# Patient Record
Sex: Male | Born: 1950 | Race: White | Hispanic: No | Marital: Married | Smoking: Former smoker
Health system: Southern US, Community
[De-identification: ages and names within clinical notes are randomized; demographics above are authoritative.]

## PROBLEM LIST (undated history)

## (undated) DIAGNOSIS — I2510 Atherosclerotic heart disease of native coronary artery without angina pectoris: Secondary | ICD-10-CM

## (undated) DIAGNOSIS — F329 Major depressive disorder, single episode, unspecified: Secondary | ICD-10-CM

## (undated) DIAGNOSIS — F32A Depression, unspecified: Secondary | ICD-10-CM

## (undated) DIAGNOSIS — F419 Anxiety disorder, unspecified: Secondary | ICD-10-CM

## (undated) DIAGNOSIS — E785 Hyperlipidemia, unspecified: Secondary | ICD-10-CM

## (undated) HISTORY — PX: HERNIA REPAIR: SHX51

## (undated) HISTORY — DX: Hyperlipidemia, unspecified: E78.5

## (undated) HISTORY — PX: CORONARY ANGIOPLASTY WITH STENT PLACEMENT: SHX49

---

## 2009-10-25 ENCOUNTER — Inpatient Hospital Stay (HOSPITAL_COMMUNITY): Admission: EM | Admit: 2009-10-25 | Discharge: 2009-10-28 | Payer: Self-pay | Admitting: Emergency Medicine

## 2009-10-25 ENCOUNTER — Encounter (INDEPENDENT_AMBULATORY_CARE_PROVIDER_SITE_OTHER): Payer: Self-pay | Admitting: Cardiology

## 2009-11-06 ENCOUNTER — Inpatient Hospital Stay (HOSPITAL_COMMUNITY): Admission: EM | Admit: 2009-11-06 | Discharge: 2009-11-07 | Payer: Self-pay | Admitting: Emergency Medicine

## 2009-11-06 ENCOUNTER — Encounter (HOSPITAL_COMMUNITY): Admission: RE | Admit: 2009-11-06 | Discharge: 2009-11-06 | Payer: Self-pay | Admitting: Cardiovascular Disease

## 2010-08-31 LAB — CBC
HCT: 36.9 % — ABNORMAL LOW (ref 39.0–52.0)
HCT: 38.7 % — ABNORMAL LOW (ref 39.0–52.0)
HCT: 39.3 % (ref 39.0–52.0)
HCT: 40 % (ref 39.0–52.0)
Hemoglobin: 13.6 g/dL (ref 13.0–17.0)
Hemoglobin: 13.8 g/dL (ref 13.0–17.0)
Hemoglobin: 13.8 g/dL (ref 13.0–17.0)
MCHC: 35.4 g/dL (ref 30.0–36.0)
MCHC: 35.6 g/dL (ref 30.0–36.0)
MCHC: 35.7 g/dL (ref 30.0–36.0)
MCV: 89.1 fL (ref 78.0–100.0)
MCV: 89.4 fL (ref 78.0–100.0)
MCV: 90.3 fL (ref 78.0–100.0)
Platelets: 221 10*3/uL (ref 150–400)
Platelets: 234 10*3/uL (ref 150–400)
RBC: 4.34 MIL/uL (ref 4.22–5.81)
RBC: 4.39 MIL/uL (ref 4.22–5.81)
RBC: 4.43 MIL/uL (ref 4.22–5.81)
RDW: 13 % (ref 11.5–15.5)
WBC: 6.8 10*3/uL (ref 4.0–10.5)
WBC: 6.9 10*3/uL (ref 4.0–10.5)
WBC: 7.3 10*3/uL (ref 4.0–10.5)

## 2010-08-31 LAB — BASIC METABOLIC PANEL
BUN: 10 mg/dL (ref 6–23)
BUN: 12 mg/dL (ref 6–23)
CO2: 22 mEq/L (ref 19–32)
CO2: 23 mEq/L (ref 19–32)
CO2: 23 mEq/L (ref 19–32)
Calcium: 8.5 mg/dL (ref 8.4–10.5)
Calcium: 8.8 mg/dL (ref 8.4–10.5)
Chloride: 108 mEq/L (ref 96–112)
Creatinine, Ser: 0.78 mg/dL (ref 0.4–1.5)
Creatinine, Ser: 0.84 mg/dL (ref 0.4–1.5)
Creatinine, Ser: 0.84 mg/dL (ref 0.4–1.5)
GFR calc Af Amer: >60 mL/min (ref >60)
GFR calc Af Amer: >60 mL/min (ref >60)
GFR calc Af Amer: >60 mL/min (ref >60)
GFR calc Af Amer: >60 mL/min (ref >60)
GFR calc non Af Amer: >60 mL/min (ref >60)
GFR calc non Af Amer: >60 mL/min (ref >60)
GFR calc non Af Amer: >60 mL/min (ref >60)
Glucose, Bld: 96 mg/dL (ref 70–99)
Glucose, Bld: 99 mg/dL (ref 70–99)
Potassium: 3.7 mEq/L (ref 3.5–5.1)
Sodium: 136 mEq/L (ref 135–145)
Sodium: 139 mEq/L (ref 135–145)
Sodium: 139 mEq/L (ref 135–145)

## 2010-08-31 LAB — HEPARIN LEVEL (UNFRACTIONATED)
Heparin Unfractionated: 0.39 IU/mL (ref 0.30–0.70)
Heparin Unfractionated: 0.39 IU/mL (ref 0.30–0.70)

## 2010-08-31 LAB — CARDIAC PANEL(CRET KIN+CKTOT+MB+TROPI)
Relative Index: 2.3 (ref 0.0–2.5)
Troponin I: 0.01 ng/mL (ref 0.00–0.06)

## 2010-08-31 LAB — DIFFERENTIAL
Basophils Absolute: 0.1 10*3/uL (ref 0.0–0.1)
Eosinophils Absolute: 0.2 10*3/uL (ref 0.0–0.7)
Eosinophils Relative: 3 % (ref 0–5)
Lymphocytes Relative: 39 % (ref 12–46)
Neutrophils Relative %: 47 % (ref 43–77)

## 2010-08-31 LAB — CK TOTAL AND CKMB (NOT AT ARMC): CK, MB: 3.4 ng/mL (ref 0.3–4.0)

## 2010-08-31 LAB — LIPID PANEL
LDL Cholesterol: 132 mg/dL — ABNORMAL HIGH (ref 0–99)
Triglycerides: 182 mg/dL — ABNORMAL HIGH (ref <150)
VLDL: 36 mg/dL (ref 0–40)

## 2010-08-31 LAB — TROPONIN I: Troponin I: 0.01 ng/mL (ref 0.00–0.06)

## 2010-08-31 LAB — PROTIME-INR: Prothrombin Time: 12.3 seconds (ref 11.6–15.2)

## 2010-09-01 ENCOUNTER — Emergency Department (HOSPITAL_COMMUNITY)
Admission: EM | Admit: 2010-09-01 | Discharge: 2010-09-01 | Disposition: A | Discharge status: Home / Self Care | Payer: 59 | Attending: Emergency Medicine | Admitting: Emergency Medicine

## 2010-09-01 DIAGNOSIS — E7800 Pure hypercholesterolemia, unspecified: Secondary | ICD-10-CM | POA: Insufficient documentation

## 2010-09-01 DIAGNOSIS — Z79899 Other long term (current) drug therapy: Secondary | ICD-10-CM | POA: Insufficient documentation

## 2010-09-01 DIAGNOSIS — F341 Dysthymic disorder: Secondary | ICD-10-CM | POA: Insufficient documentation

## 2010-09-01 DIAGNOSIS — I2510 Atherosclerotic heart disease of native coronary artery without angina pectoris: Secondary | ICD-10-CM | POA: Insufficient documentation

## 2010-09-01 DIAGNOSIS — Z7982 Long term (current) use of aspirin: Secondary | ICD-10-CM | POA: Insufficient documentation

## 2010-09-01 DIAGNOSIS — I10 Essential (primary) hypertension: Secondary | ICD-10-CM | POA: Insufficient documentation

## 2010-09-01 LAB — POCT CARDIAC MARKERS
CKMB, poc: 2.9 ng/mL (ref 1.0–8.0)
Myoglobin, poc: 125 ng/mL (ref 12–200)
Troponin i, poc: <0.05 ng/mL (ref 0.00–0.09)

## 2010-09-01 LAB — CARDIAC PANEL(CRET KIN+CKTOT+MB+TROPI)
CK, MB: 4.1 ng/mL — ABNORMAL HIGH (ref 0.3–4.0)
Total CK: 170 U/L (ref 7–232)
Total CK: 186 U/L (ref 7–232)
Troponin I: 0.01 ng/mL (ref 0.00–0.06)

## 2010-09-01 LAB — CBC
HCT: 40.4 % (ref 39.0–52.0)
Hemoglobin: 14.3 g/dL (ref 13.0–17.0)
MCV: 89.8 fL (ref 78.0–100.0)
RDW: 13.5 % (ref 11.5–15.5)
WBC: 10.2 10*3/uL (ref 4.0–10.5)

## 2010-09-01 LAB — BASIC METABOLIC PANEL
Chloride: 108 mEq/L (ref 96–112)
Potassium: 3.9 mEq/L (ref 3.5–5.1)
Sodium: 137 mEq/L (ref 135–145)

## 2010-09-01 LAB — TSH: TSH: 2.334 u[IU]/mL (ref 0.350–4.500)

## 2010-09-01 LAB — HEMOGLOBIN A1C: Mean Plasma Glucose: 117 mg/dL — ABNORMAL HIGH (ref <117)

## 2010-09-01 LAB — DIFFERENTIAL
Monocytes Relative: 8 % (ref 3–12)
Neutro Abs: 6 10*3/uL (ref 1.7–7.7)
Neutrophils Relative %: 59 % (ref 43–77)

## 2010-09-01 LAB — PROTIME-INR: INR: 0.92 (ref 0.00–1.49)

## 2011-06-15 HISTORY — PX: NM MYOVIEW LTD: HXRAD82

## 2011-12-22 ENCOUNTER — Emergency Department (HOSPITAL_COMMUNITY): Payer: Managed Care, Other (non HMO)

## 2011-12-22 ENCOUNTER — Observation Stay (HOSPITAL_COMMUNITY)
Admission: EM | Admit: 2011-12-22 | Discharge: 2011-12-23 | Disposition: A | Discharge status: Home / Self Care | Payer: Managed Care, Other (non HMO) | Attending: Internal Medicine | Admitting: Internal Medicine

## 2011-12-22 ENCOUNTER — Encounter (HOSPITAL_COMMUNITY): Payer: Self-pay | Admitting: *Deleted

## 2011-12-22 DIAGNOSIS — E669 Obesity, unspecified: Secondary | ICD-10-CM | POA: Insufficient documentation

## 2011-12-22 DIAGNOSIS — F3181 Bipolar II disorder: Secondary | ICD-10-CM | POA: Diagnosis present

## 2011-12-22 DIAGNOSIS — Z6831 Body mass index (BMI) 31.0-31.9, adult: Secondary | ICD-10-CM | POA: Insufficient documentation

## 2011-12-22 DIAGNOSIS — F3189 Other bipolar disorder: Secondary | ICD-10-CM

## 2011-12-22 DIAGNOSIS — I2510 Atherosclerotic heart disease of native coronary artery without angina pectoris: Secondary | ICD-10-CM | POA: Insufficient documentation

## 2011-12-22 DIAGNOSIS — F410 Panic disorder [episodic paroxysmal anxiety] without agoraphobia: Principal | ICD-10-CM | POA: Insufficient documentation

## 2011-12-22 DIAGNOSIS — R079 Chest pain, unspecified: Secondary | ICD-10-CM | POA: Insufficient documentation

## 2011-12-22 HISTORY — DX: Depression, unspecified: F32.A

## 2011-12-22 HISTORY — DX: Major depressive disorder, single episode, unspecified: F32.9

## 2011-12-22 HISTORY — DX: Atherosclerotic heart disease of native coronary artery without angina pectoris: I25.10

## 2011-12-22 HISTORY — DX: Anxiety disorder, unspecified: F41.9

## 2011-12-22 LAB — CBC WITH DIFFERENTIAL/PLATELET
Basophils Absolute: 0 10*3/uL (ref 0.0–0.1)
Eosinophils Absolute: 0 10*3/uL (ref 0.0–0.7)
Eosinophils Relative: 1 % (ref 0–5)
MCH: 30.7 pg (ref 26.0–34.0)
MCHC: 34.2 g/dL (ref 30.0–36.0)
MCV: 89.6 fL (ref 78.0–100.0)
Monocytes Absolute: 0.6 10*3/uL (ref 0.1–1.0)
Platelets: 223 10*3/uL (ref 150–400)
RDW: 12.6 % (ref 11.5–15.5)

## 2011-12-22 LAB — HEPATIC FUNCTION PANEL
Alkaline Phosphatase: 54 U/L (ref 39–117)
Bilirubin, Direct: 0.1 mg/dL (ref 0.0–0.3)
Indirect Bilirubin: 0.3 mg/dL (ref 0.3–0.9)
Total Protein: 6.4 g/dL (ref 6.0–8.3)

## 2011-12-22 LAB — BASIC METABOLIC PANEL
BUN: 18 mg/dL (ref 6–23)
Calcium: 9.2 mg/dL (ref 8.4–10.5)
Creatinine, Ser: 0.89 mg/dL (ref 0.50–1.35)
GFR calc Af Amer: >90 mL/min (ref >90)

## 2011-12-22 LAB — TROPONIN I: Troponin I: <0.3 ng/mL (ref <0.30)

## 2011-12-22 MED ORDER — OMEGA-3-ACID ETHYL ESTERS 1 G PO CAPS
2.0000 | ORAL_CAPSULE | Freq: Two times a day (BID) | ORAL | Status: DC
  Administered 2011-12-23: 2 g via ORAL
  Filled 2011-12-22 (×2): qty 2

## 2011-12-22 MED ORDER — CLOPIDOGREL BISULFATE 75 MG PO TABS
75.0000 mg | ORAL_TABLET | Freq: Every day | ORAL | Status: DC
  Administered 2011-12-23: 75 mg via ORAL
  Filled 2011-12-22: qty 1

## 2011-12-22 MED ORDER — ASPIRIN 81 MG PO CHEW
324.0000 mg | CHEWABLE_TABLET | Freq: Once | ORAL | Status: AC
  Administered 2011-12-22: 324 mg via ORAL
  Filled 2011-12-22: qty 4

## 2011-12-22 MED ORDER — SIMVASTATIN 20 MG PO TABS: 20.0000 mg | ORAL_TABLET | Freq: Every evening | ORAL | Status: DC

## 2011-12-22 MED ORDER — ASPIRIN EC 81 MG PO TBEC
81.0000 mg | DELAYED_RELEASE_TABLET | Freq: Every day | ORAL | Status: DC
  Administered 2011-12-23: 81 mg via ORAL
  Filled 2011-12-22: qty 1

## 2011-12-22 MED ORDER — LAMOTRIGINE 100 MG PO TABS
100.0000 mg | ORAL_TABLET | Freq: Every day | ORAL | Status: DC
  Filled 2011-12-22: qty 1

## 2011-12-22 MED ORDER — ENOXAPARIN SODIUM 40 MG/0.4ML ~~LOC~~ SOLN
40.0000 mg | SUBCUTANEOUS | Status: DC
  Administered 2011-12-23: 40 mg via SUBCUTANEOUS
  Filled 2011-12-22 (×2): qty 0.4

## 2011-12-22 MED ORDER — POTASSIUM CHLORIDE IN NACL 20-0.9 MEQ/L-% IV SOLN
INTRAVENOUS | Status: DC
  Administered 2011-12-23 (×2): via INTRAVENOUS

## 2011-12-22 MED ORDER — ONDANSETRON HCL 4 MG/2ML IJ SOLN: 4.0000 mg | INTRAMUSCULAR | Status: DC | PRN

## 2011-12-22 MED ORDER — SODIUM CHLORIDE 0.9 % IJ SOLN
3.0000 mL | Freq: Two times a day (BID) | INTRAMUSCULAR | Status: DC
  Administered 2011-12-23: 3 mL via INTRAVENOUS
  Filled 2011-12-22: qty 3

## 2011-12-22 MED ORDER — PANTOPRAZOLE SODIUM 40 MG PO TBEC
40.0000 mg | DELAYED_RELEASE_TABLET | Freq: Every day | ORAL | Status: DC
  Administered 2011-12-23 (×2): 40 mg via ORAL
  Filled 2011-12-22 (×2): qty 1

## 2011-12-22 MED ORDER — HYDROMORPHONE HCL PF 1 MG/ML IJ SOLN
0.5000 mg | INTRAMUSCULAR | Status: DC | PRN
  Administered 2011-12-23: 0.5 mg via INTRAVENOUS
  Filled 2011-12-22: qty 1

## 2011-12-22 MED ORDER — TRAZODONE HCL 50 MG PO TABS
25.0000 mg | ORAL_TABLET | Freq: Every evening | ORAL | Status: DC | PRN
  Administered 2011-12-23: 25 mg via ORAL
  Filled 2011-12-22: qty 1

## 2011-12-22 MED ORDER — BISACODYL 5 MG PO TBEC: 5.0000 mg | DELAYED_RELEASE_TABLET | Freq: Every day | ORAL | Status: DC | PRN

## 2011-12-22 MED ORDER — DIVALPROEX SODIUM ER 500 MG PO TB24: 1000.0000 mg | ORAL_TABLET | Freq: Every day | ORAL | Status: DC

## 2011-12-22 MED ORDER — ACETAMINOPHEN 325 MG PO TABS: 650.0000 mg | ORAL_TABLET | ORAL | Status: DC | PRN

## 2011-12-22 MED ORDER — OXYCODONE HCL 5 MG PO TABS
5.0000 mg | ORAL_TABLET | ORAL | Status: DC | PRN
  Administered 2011-12-23: 5 mg via ORAL
  Filled 2011-12-22: qty 1

## 2011-12-22 NOTE — ED Notes (Signed)
Lt chest pain for 3 days, worse today, no cough or cold, no injury. Alert, anxious

## 2011-12-22 NOTE — H&P (Signed)
[facility] History and Physical  Maurice Duffy ZOX:[id number] DOB: 05-Aug-1950 DOA: 12/22/2011   PCP: [facility] Cardiologist: Dr. Nanetta Batty  Chief Complaint:  Episodic chest pain for the past 2 days  HPI: Maurice Duffy is an 61 y.o. male.   Middle-aged Caucasian gentleman with a history of anxiety and bipolar,  Disorder, coronary artery disease status post drug-eluting stent, with patent stents and arteries on cardiac catheterization in 2011. Now presents with the above complaints.  The pain is described as sticking in his arms and chest lasting for seconds to a few minutes; aggravated by deep breathing or moving his arms; aggravated by stress relieved by remaining calm; There is no associated palpitations nausea vomiting diaphoresis dizziness or syncope.  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 He says he had an episode of anxiety yesterday morning which she's never had the likes of which before and thinks it may have been something similar to a panic attack.  He works out in a gym 3 times per week and has been doing that steadily for over 3 months, lifting weights for up to 2 hours at a time and there's never been any chest pains, or any other anginal-like symptoms while at the gym. He usually takes Prevacid for GI problems which she describes as gastroparesis, but recently ran out  Takes all his other medications regularly including his Plavix;  Has been started on Depakote and Lamictal for his bipolar disorder and says it has been helping. He does not get any form of counseling from his psychiatrist.  He is concerned about his serum lipids.  Rewiew of Systems:  The patient denies anorexia, fever, weight loss,, vision loss, decreased hearing, hoarseness,  syncope, dyspnea on exertion, peripheral edema, balance deficits,  hemoptysis, abdominal pain, melena, hematochezia, severe indigestion/heartburn, hematuria, incontinence, genital sores, muscle weakness, suspicious skin lesions, transient blindness, difficulty walking, unusual weight change, abnormal bleeding, enlarged lymph nodes, angioedema, and breast masses.    Past Medical History  Diagnosis Date  . Coronary artery disease   . Anxiety   . Depression     Past Surgical History  Procedure Date  . Coronary angioplasty with stent placement     Medications:  HOME MEDS: Prior to Admission medications   Medication Sig Start Date End Date Taking? Authorizing Provider  clopidogrel (PLAVIX) 75 MG tablet Take 75 mg by mouth daily.   Yes Historical Provider, MD  divalproex (DEPAKOTE ER) 500 MG 24 hr tablet Take 1,000 mg by mouth at bedtime.   Yes Historical Provider, MD  fish oil-omega-3 fatty acids 1000 MG capsule Take 2 g by mouth 2 (two) times daily.   Yes Historical Provider, MD  lamoTRIgine (LAMICTAL) 100 MG tablet Take 100 mg by mouth at bedtime.   Yes Historical Provider, MD  simvastatin (ZOCOR) 20 MG tablet Take 20 mg by mouth every evening.   Yes Historical Provider, MD     Allergies:  No Known Allergies  Social History:   reports that he quit smoking about 4 months ago. He does not have any smokeless tobacco history on file. He reports that he does not drink alcohol or use illicit drugs.  Family History: Family History  Problem Relation Age of Onset  . Coronary artery disease Father 21     Physical Exam: Filed Vitals:   12/22/11 1850  12/22/11 1944 12/22/11 2105 12/22/11 2149  BP: 145/88 145/79 126/55 159/80  Pulse: 77 72 66 59  Temp: 99.2 F (37.3 C)   97.9 F (36.6 C)  TempSrc: Oral   Oral  Resp: 20 16 17 18   Height:    5\' 9"  (1.753 m)  Weight:    95.6 kg (210 lb 12.2 oz)  SpO2: 95% 96% 99% 97%   Blood pressure 159/80, pulse 59, temperature 97.9 F (36.6 C), temperature source Oral, resp. rate 18, height 5\' 9"  (1.753 m),  weight 95.6 kg (210 lb 12.2 oz), SpO2 97.00%.  GEN:  Pleasant pleasant but extremely anxious middle-aged Caucasian gentleman lying in the stretcher in no acute distress; cooperative with exam PSYCH:  alert and oriented x4; appears anxious and frequently seems to get confused when attempting to answer questions and his wife and daughter were present at the interview assist him.;  HEENT: Mucous membranes pink and anicteric; PERRLA; EOM intact; no cervical lymphadenopathy nor thyromegaly or carotid bruit; no JVD; Breasts:: Not examined CHEST WALL: No tenderness CHEST: Normal respiration, clear to auscultation bilaterally HEART: Regular rate and rhythm; no murmurs rubs or gallops BACK: No kyphosis or scoliosis; no CVA tenderness ABDOMEN: Obese, soft non-tender; no masses, no organomegaly, normal abdominal bowel sounds; Rectal Exam: Not done EXTREMITIES: ; age-appropriate arthropathy of the hands and knees; no edema; no ulcerations. Genitalia: not examined PULSES: 2+ and symmetric SKIN: Normal hydration no rash or ulceration CNS: Cranial nerves 2-12 grossly intact no focal lateralizing neurologic deficit   Labs & Imaging Results for orders placed during the hospital encounter of 12/22/11 (from the past 48 hour(s))  CBC WITH DIFFERENTIAL     Status: Normal   Collection Time   12/22/11  6:54 PM      Component Value Range Comment   WBC 7.1  4.0 - 10.5 K/uL    RBC 4.60  4.22 - 5.81 MIL/uL    Hemoglobin 14.1  13.0 - 17.0 g/dL    HCT 14.7  82.9 - 56.2 %    MCV 89.6  78.0 - 100.0 fL    MCH 30.7  26.0 - 34.0 pg    MCHC 34.2  30.0 - 36.0 g/dL    RDW 13.0  86.5 - 78.4 %    Platelets 223  150 - 400 K/uL    Neutrophils Relative 67  43 - 77 %    Neutro Abs 4.8  1.7 - 7.7 K/uL    Lymphocytes Relative 23  12 - 46 %    Lymphs Abs 1.6  0.7 - 4.0 K/uL    Monocytes Relative 9  3 - 12 %    Monocytes Absolute 0.6  0.1 - 1.0 K/uL    Eosinophils Relative 1  0 - 5 %    Eosinophils Absolute 0.0  0.0 - 0.7  K/uL    Basophils Relative 0  0 - 1 %    Basophils Absolute 0.0  0.0 - 0.1 K/uL   TROPONIN I     Status: Normal   Collection Time   12/22/11  6:54 PM      Component Value Range Comment   Troponin I <0.30  <0.30 ng/mL   BASIC METABOLIC PANEL     Status: Abnormal   Collection Time   12/22/11  6:54 PM      Component Value Range Comment   Sodium 138  135 - 145 mEq/L    Potassium 3.6  3.5 - 5.1 mEq/L    Chloride 104  96 - 112 mEq/L    CO2 23  19 - 32 mEq/L    Glucose, Bld 135 (*) 70 - 99 mg/dL    BUN 18  6 - 23 mg/dL    Creatinine, Ser 1.61  0.50 - 1.35 mg/dL    Calcium 9.2  8.4 - 09.6 mg/dL    GFR calc non Af Amer >90  >90 mL/min    GFR calc Af Amer >90  >90 mL/min   HEPATIC FUNCTION PANEL     Status: Normal   Collection Time   12/22/11  6:54 PM      Component Value Range Comment   Total Protein 6.4  6.0 - 8.3 g/dL    Albumin 3.9  3.5 - 5.2 g/dL    AST 19  0 - 37 U/L    ALT 17  0 - 53 U/L    Alkaline Phosphatase 54  39 - 117 U/L    Total Bilirubin 0.4  0.3 - 1.2 mg/dL    Bilirubin, Direct 0.1  0.0 - 0.3 mg/dL    Indirect Bilirubin 0.3  0.3 - 0.9 mg/dL    Dg Chest Portable 1 View  12/22/2011  *RADIOLOGY REPORT*  Clinical Data: Intermittent chest pain.  Coronary artery disease. Smoking history.  PORTABLE CHEST - 1 VIEW  Comparison: 11/06/2009  Findings: Artifact overlies chest.  Heart size is normal. Mediastinal shadows are normal.  Lungs clear.  No effusions.  No bony abnormalities.  IMPRESSION: No active disease  Original Report Authenticated By: Thomasenia Sales, M.D.   His EKG read by me is normal sinus rhythm with no ST segment abnormalities   Assessment Present on Admission:  .Chest pain .CAD (coronary artery disease) .Anxiety attack Possible Gastroparesis/GERD  .Bipolar 2 disorder  .Obesity    PLAN: We'll continue this gentleman on observation to cycle his cardiac enzymes to rule out any acute myocardial events Continue treatment of his bipolar disorder; Will not  start anxiolytics will counsel on the need for therapy, meditation or other proven stress management strategy. We'll give her PPI to cover for GERD Counseled on the importance of optimal management of weight We'll check fasting serum lipids  Other plans as per orders.   Guyla Bless 12/22/2011, 10:57 PM

## 2011-12-22 NOTE — ED Notes (Signed)
X ray in room for chest xray.

## 2011-12-22 NOTE — ED Provider Notes (Cosign Needed)
History     CSN: [id number]  Arrival date & time 12/22/11  1840   First MD Initiated Contact with Patient 12/22/11 1843      Chief Complaint  Patient presents with  . Chest Pain    (Consider location/radiation/quality/duration/timing/severity/associated sxs/prior treatment) Patient is a 61 y.o. male presenting with chest pain. The history is provided by the patient (pt complains of chest pain today). No language interpreter was used.  Chest Pain The chest pain began 3 - 5 hours ago. Chest pain occurs frequently. The chest pain is resolved. The pain is associated with stress. At its most intense, the pain is at 4/10. The pain is currently at 0/10. The severity of the pain is moderate. The quality of the pain is described as aching. The pain does not radiate. Pertinent negatives for primary symptoms include no fatigue, no cough and no abdominal pain.  Pertinent negatives for past medical history include no seizures.     Past Medical History  Diagnosis Date  . Coronary artery disease   . Anxiety   . Depression     Past Surgical History  Procedure Date  . Coronary angioplasty with stent placement     History reviewed. No pertinent family history.  History  Substance Use Topics  . Smoking status: Former Games developer  . Smokeless tobacco: Not on file  . Alcohol Use: No      Review of Systems  Constitutional: Negative for fatigue.  HENT: Negative for congestion, sinus pressure and ear discharge.   Eyes: Negative for discharge.  Respiratory: Negative for cough.   Cardiovascular: Positive for chest pain.  Gastrointestinal: Negative for abdominal pain and diarrhea.  Genitourinary: Negative for frequency and hematuria.  Musculoskeletal: Negative for back pain.  Skin: Negative for rash.  Neurological: Negative for seizures and headaches.  Hematological: Negative.   Psychiatric/Behavioral: Negative for hallucinations.    Allergies  Review of patient's allergies indicates no  known allergies.  Home Medications  No current outpatient prescriptions on file.  BP 126/55  Pulse 66  Temp 99.2 F (37.3 C) (Oral)  Resp 17  Ht 5\' 9"  (1.753 m)  Wt 210 lb (95.255 kg)  BMI 31.01 kg/m2  SpO2 99%  Physical Exam  Constitutional: He is oriented to person, place, and time. He appears well-developed.  HENT:  Head: Normocephalic and atraumatic.  Eyes: Conjunctivae and EOM are normal. No scleral icterus.  Neck: Neck supple. No thyromegaly present.  Cardiovascular: Normal rate and regular rhythm.  Exam reveals no gallop and no friction rub.   No murmur heard. Pulmonary/Chest: No stridor. He has no wheezes. He has no rales. He exhibits no tenderness.  Abdominal: He exhibits no distension. There is no tenderness. There is no rebound.  Musculoskeletal: Normal range of motion. He exhibits no edema.  Lymphadenopathy:    He has no cervical adenopathy.  Neurological: He is oriented to person, place, and time. Coordination normal.  Skin: No rash noted. No erythema.  Psychiatric: He has a normal mood and affect. His behavior is normal.    ED Course  Procedures (including critical care time)  Labs Reviewed  BASIC METABOLIC PANEL - Abnormal; Notable for the following:    Glucose, Bld 135 (*)     All other components within normal limits  CBC WITH DIFFERENTIAL  TROPONIN I  HEPATIC FUNCTION PANEL   Dg Chest Portable 1 View  12/22/2011  *RADIOLOGY REPORT*  Clinical Data: Intermittent chest pain.  Coronary artery disease. Smoking history.  PORTABLE CHEST -  1 VIEW  Comparison: 11/06/2009  Findings: Artifact overlies chest.  Heart size is normal. Mediastinal shadows are normal.  Lungs clear.  No effusions.  No bony abnormalities.  IMPRESSION: No active disease  Original Report Authenticated By: Thomasenia Sales, M.D.     1. Chest pain     Date: 12/22/2011  Rate:75  Rhythm: normal sinus rhythm  QRS Axis: normal  Intervals: normal  ST/T Wave abnormalities: normal   Conduction Disutrbances:none  Narrative Interpretation:   Old EKG Reviewed: none available     MDM           Benny Lennert, MD 12/22/11 2109

## 2011-12-23 ENCOUNTER — Encounter (HOSPITAL_COMMUNITY): Payer: Self-pay | Admitting: *Deleted

## 2011-12-23 DIAGNOSIS — E669 Obesity, unspecified: Secondary | ICD-10-CM

## 2011-12-23 LAB — URINALYSIS, ROUTINE W REFLEX MICROSCOPIC
Bilirubin Urine: NEGATIVE
Glucose, UA: NEGATIVE mg/dL
Nitrite: NEGATIVE
Specific Gravity, Urine: 1.015 (ref 1.005–1.030)
Urobilinogen, UA: 0.2 mg/dL (ref 0.0–1.0)

## 2011-12-23 LAB — LIPID PANEL
LDL Cholesterol: 60 mg/dL (ref 0–99)
Total CHOL/HDL Ratio: 3.1 RATIO
VLDL: 31 mg/dL (ref 0–40)

## 2011-12-23 LAB — CARDIAC PANEL(CRET KIN+CKTOT+MB+TROPI)
Relative Index: 2.1 (ref 0.0–2.5)
Troponin I: <0.3 ng/mL (ref <0.30)

## 2011-12-23 LAB — T4, FREE: Free T4: 1.09 ng/dL (ref 0.80–1.80)

## 2011-12-23 LAB — TSH: TSH: 3.528 u[IU]/mL (ref 0.350–4.500)

## 2011-12-23 LAB — URINE MICROSCOPIC-ADD ON

## 2011-12-23 MED ORDER — ISOSORBIDE MONONITRATE ER 30 MG PO TB24: 30.0000 mg | ORAL_TABLET | Freq: Every day | ORAL | Status: DC

## 2011-12-23 MED ORDER — LAMOTRIGINE 100 MG PO TABS
100.0000 mg | ORAL_TABLET | Freq: Every day | ORAL | Status: DC
  Filled 2011-12-23 (×2): qty 1

## 2011-12-23 MED ORDER — ASPIRIN 81 MG PO TBEC: 81.0000 mg | DELAYED_RELEASE_TABLET | Freq: Every day | ORAL | Status: AC

## 2011-12-23 MED ORDER — SODIUM CHLORIDE 0.9 % IJ SOLN
INTRAMUSCULAR | Status: AC
  Administered 2011-12-23: 10 mL
  Filled 2011-12-23: qty 3

## 2011-12-23 NOTE — Care Management Note (Signed)
    Page 1 of 1   12/23/2011     9:24:51 AM   CARE MANAGEMENT NOTE 12/23/2011  Patient:  Maurice Duffy, Maurice Duffy   Account Number:  [id number]  Date Initiated:  12/23/2011  Documentation initiated by:  Rosemary Holms  Subjective/Objective Assessment:   Pt admitted with C/O chest pain. Lives independently at home with spouse     Action/Plan:   DC home w/ no HH needs identified.   Anticipated DC Date:  12/23/2011   Anticipated DC Plan:  HOME/SELF CARE      DC Planning Services  CM consult      Choice offered to / List presented to:             Status of service:  Completed, signed off Medicare Important Message given?   (If response is "NO", the following Medicare IM given date fields will be blank) Date Medicare IM given:   Date Additional Medicare IM given:    Discharge Disposition:  HOME/SELF CARE  Per UR Regulation:  Reviewed for med. necessity/level of care/duration of stay  If discussed at Long Length of Stay Meetings, dates discussed:    Comments:  12/23/11 [address] Leanord Hawking RN BSN CM

## 2011-12-23 NOTE — Discharge Summary (Signed)
Physician Discharge Summary  Maurice Duffy ZOX:[id number] DOB: [birth year] DOA: 12/22/2011  PCP: No primary provider on file.  Admit date: 12/22/2011 Discharge date: 12/23/2011  Recommendations for Outpatient Follow-up:  1. Follow up with Dr. Allyson Sabal next week for outpatient stress test  Discharge Diagnoses:  Active Problems:  Anxiety attack  Bipolar 2 disorder  Chest pain, atypical, possibly related to anxiety  Obesity  CAD (coronary artery disease) with prior stents   Discharge Condition: improved  Diet recommendation: low salt  History of present illness:  Maurice Duffy is an 61 y.o. male. Middle-aged Caucasian gentleman with a history of anxiety and bipolar, Disorder, coronary artery disease status post drug-eluting stent, with patent stents and arteries on cardiac catheterization in 2011. Now presents with the above complaints.  The pain is described as sticking in his arms and chest lasting for seconds to a few minutes; aggravated by deep breathing or moving his arms; aggravated by stress relieved by remaining calm;  There is no associated palpitations nausea vomiting diaphoresis dizziness or syncope.  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 He says he had an episode of anxiety yesterday morning which she's never had the likes of which before and thinks it may have been something similar to a panic attack.  He works out in a gym 3 times per week and has been doing that steadily for over 3 months, lifting weights for up to 2 hours at a time and there's never been any chest pains, or any other anginal-like symptoms while at the gym.  He usually takes Prevacid for GI problems which she describes as gastroparesis, but recently ran out  Takes all his other medications regularly including his Plavix;  Has been started on Depakote and Lamictal for his  bipolar disorder and says it has been helping. He does not get any form of counseling from his psychiatrist.  He is concerned about his serum lipids.   Hospital Course:  This gentleman was admitted overnight for observation.  He did not have any acute EKG changes and his enzymes were negative.  He was seen by Dr. Allyson Sabal who recommended to start the patient on imdur , and that he would be scheduled for an outpatient stress test next week.  The remainder of his medical problems were stable.  He was advised not to take a proton pump inhibitor while on plavix.  He should use H2 blockers as needed for reflux  Procedures:  none  Consultations:  Cardiology, Dr. Allyson Sabal  Discharge Exam: Filed Vitals:   12/23/11 0519  BP: 110/64  Pulse: 57  Temp: 97.5 F (36.4 C)  Resp: 18   Filed Vitals:   12/22/11 2105 12/22/11 2149 12/23/11 0228 12/23/11 0519  BP: 126/55 159/80 105/66 110/64  Pulse: 66 59 56 57  Temp:  97.9 F (36.6 C) 97.7 F (36.5 C) 97.5 F (36.4 C)  TempSrc:  Oral Oral Oral  Resp: 17 18 18 18   Height:  5\' 9"  (1.753 m)    Weight:  95.6 kg (210 lb 12.2 oz)  95.3 kg (210 lb 1.6 oz)  SpO2: 99% 97% 97% 92%   General: NAD, sitting up in bed, AAOx3 Cardiovascular: s1, s2, rrr Respiratory:  CTA B  Discharge Instructions  Discharge Orders    Future Orders Please Complete By Expires   Diet - low sodium  heart healthy      Increase activity slowly      Call MD for:  severe uncontrolled pain      Call MD for:  difficulty breathing, headache or visual disturbances        Medication List  As of 12/23/2011 12:30 PM   TAKE these medications         aspirin 81 MG EC tablet   Take 1 tablet (81 mg total) by mouth daily.      clopidogrel 75 MG tablet   Commonly known as: PLAVIX   Take 75 mg by mouth daily.      divalproex 500 MG 24 hr tablet   Commonly known as: DEPAKOTE ER   Take 1,000 mg by mouth at bedtime.      fish oil-omega-3 fatty acids 1000 MG capsule   Take 2 g by  mouth 2 (two) times daily.      isosorbide mononitrate 30 MG 24 hr tablet   Commonly known as: IMDUR   Take 1 tablet (30 mg total) by mouth daily.      lamoTRIgine 100 MG tablet   Commonly known as: LAMICTAL   Take 100 mg by mouth daily.      simvastatin 20 MG tablet   Commonly known as: ZOCOR   Take 20 mg by mouth every evening.           Follow-up Information    Follow up with Runell Gess, MD. (next week for stress test)    Contact information:   [address] [place] [address] [phone]           The results of significant diagnostics from this hospitalization (including imaging, microbiology, ancillary and laboratory) are listed below for reference.    Significant Diagnostic Studies: Dg Chest Portable 1 View  12/22/2011  *RADIOLOGY REPORT*  Clinical Data: Intermittent chest pain.  Coronary artery disease. Smoking history.  PORTABLE CHEST - 1 VIEW  Comparison: 11/06/2009  Findings: Artifact overlies chest.  Heart size is normal. Mediastinal shadows are normal.  Lungs clear.  No effusions.  No bony abnormalities.  IMPRESSION: No active disease  Original Report Authenticated By: Thomasenia Sales, M.D.    Microbiology: No results found for this or any previous visit (from the past 240 hour(s)).   Labs: Basic Metabolic Panel:  Lab 12/22/11 8295  NA 138  K 3.6  CL 104  CO2 23  GLUCOSE 135*  BUN 18  CREATININE 0.89  CALCIUM 9.2  MG --  PHOS --   Liver Function Tests:  Lab 12/22/11 1854  AST 19  ALT 17  ALKPHOS 54  BILITOT 0.4  PROT 6.4  ALBUMIN 3.9   No results found for this basename: LIPASE:5,AMYLASE:5 in the last 168 hours No results found for this basename: AMMONIA:5 in the last 168 hours CBC:  Lab 12/22/11 1854  WBC 7.1  NEUTROABS 4.8  HGB 14.1  HCT 41.2  MCV 89.6  PLT 223   Cardiac Enzymes:  Lab 12/23/11 0655 12/22/11 2337 12/22/11 1854  CKTOTAL 222 261* --  CKMB 4.7* 3.8 --  CKMBINDEX -- -- --    TROPONINI <0.30 <0.30 <0.30   BNP: BNP (last 3 results) No results found for this basename: PROBNP:3 in the last 8760 hours CBG: No results found for this basename: GLUCAP:5 in the last 168 hours  Time coordinating discharge:  Signed:  MEMON,JEHANZEB  [facility] 12/23/2011, 12:30 PM

## 2011-12-23 NOTE — Consult Note (Signed)
Reason for Consult: Chest pain  Requesting Physician: [facility]  HPI: This is a 61 y.o. male with a past medical history significant for CAD status post stenting of his mid LAD by myself using a Taxus drug-eluting stent May 2011. 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 He does have an element  of anxiety as well as GERD. He developed atypical chest pain this past Monday and again on Tuesday and Wednesday with some left upper extremity radiation. He was admitted to [facility] forrule out myocardial infarction.Marland Kitchen  PMHx:  Past Medical History  Diagnosis Date  . Coronary artery disease   . Anxiety   . Depression    Past Surgical History  Procedure Date  . Coronary angioplasty with stent placement     FAMHx: Family History  Problem Relation Age of Onset  . Coronary artery disease Father 39    SOCHx:  reports that he quit smoking about 4 months ago. He does not have any smokeless tobacco history on file. He reports that he does not drink alcohol or use illicit drugs.  ALLERGIES: No Known Allergies  ROS: Pertinent items are noted in HPI.  HOME MEDICATIONS: Prescriptions prior to admission  Medication Sig Dispense Refill  . clopidogrel (PLAVIX) 75 MG tablet Take 75 mg by mouth daily.      . divalproex (DEPAKOTE ER) 500 MG 24 hr tablet Take 1,000 mg by mouth at bedtime.      . fish oil-omega-3 fatty acids 1000 MG capsule Take 2 g by mouth 2 (two) times daily.      Marland Kitchen lamoTRIgine (LAMICTAL) 100 MG tablet Take 100 mg by mouth daily.      . simvastatin (ZOCOR) 20 MG tablet Take 20 mg by mouth every evening.        HOSPITAL MEDICATIONS: I have reviewed the patient's current medications.  VITALS: Blood pressure 110/64, pulse 57, temperature 97.5 F (36.4 C), temperature source Oral, resp. rate 18, height 5\' 9"  (1.753 m), weight 95.3  kg (210 lb 1.6 oz), SpO2 92.00%.  PHYSICAL EXAM: General appearance: alert, cooperative, appears stated age and no distress Neck: no adenopathy, no carotid bruit, no JVD, supple, symmetrical, trachea midline and thyroid not enlarged, symmetric, no tenderness/mass/nodules Lungs: clear to auscultation bilaterally Heart: regular rate and rhythm, S1, S2 normal, no murmur, click, rub or gallop Abdomen: soft, non-tender; bowel sounds normal; no masses,  no organomegaly Extremities: extremities normal, atraumatic, no cyanosis or edema Pulses: 2+ and symmetric  LABS: Results for orders placed during the hospital encounter of 12/22/11 (from the past 48 hour(s))  CBC WITH DIFFERENTIAL     Status: Normal   Collection Time   12/22/11  6:54 PM      Component Value Range Comment   WBC 7.1  4.0 - 10.5 K/uL    RBC 4.60  4.22 - 5.81 MIL/uL    Hemoglobin 14.1  13.0 - 17.0 g/dL    HCT 16.1  09.6 - 04.5 %    MCV 89.6  78.0 - 100.0 fL    MCH 30.7  26.0 - 34.0 pg    MCHC 34.2  30.0 - 36.0 g/dL    RDW 40.9  81.1 - 91.4 %    Platelets 223  150 - 400 K/uL    Neutrophils Relative 67  43 - 77 %    Neutro Abs 4.8  1.7 - 7.7  K/uL    Lymphocytes Relative 23  12 - 46 %    Lymphs Abs 1.6  0.7 - 4.0 K/uL    Monocytes Relative 9  3 - 12 %    Monocytes Absolute 0.6  0.1 - 1.0 K/uL    Eosinophils Relative 1  0 - 5 %    Eosinophils Absolute 0.0  0.0 - 0.7 K/uL    Basophils Relative 0  0 - 1 %    Basophils Absolute 0.0  0.0 - 0.1 K/uL   TROPONIN I     Status: Normal   Collection Time   12/22/11  6:54 PM      Component Value Range Comment   Troponin I <0.30  <0.30 ng/mL   BASIC METABOLIC PANEL     Status: Abnormal   Collection Time   12/22/11  6:54 PM      Component Value Range Comment   Sodium 138  135 - 145 mEq/L    Potassium 3.6  3.5 - 5.1 mEq/L    Chloride 104  96 - 112 mEq/L    CO2 23  19 - 32 mEq/L    Glucose, Bld 135 (*) 70 - 99 mg/dL    BUN 18  6 - 23 mg/dL    Creatinine, Ser 1.61  0.50 - 1.35 mg/dL     Calcium 9.2  8.4 - 10.5 mg/dL    GFR calc non Af Amer >90  >90 mL/min    GFR calc Af Amer >90  >90 mL/min   HEPATIC FUNCTION PANEL     Status: Normal   Collection Time   12/22/11  6:54 PM      Component Value Range Comment   Total Protein 6.4  6.0 - 8.3 g/dL    Albumin 3.9  3.5 - 5.2 g/dL    AST 19  0 - 37 U/L    ALT 17  0 - 53 U/L    Alkaline Phosphatase 54  39 - 117 U/L    Total Bilirubin 0.4  0.3 - 1.2 mg/dL    Bilirubin, Direct 0.1  0.0 - 0.3 mg/dL    Indirect Bilirubin 0.3  0.3 - 0.9 mg/dL   CARDIAC PANEL(CRET KIN+CKTOT+MB+TROPI)     Status: Abnormal   Collection Time   12/22/11 11:37 PM      Component Value Range Comment   Total CK 261 (*) 7 - 232 U/L    CK, MB 3.8  0.3 - 4.0 ng/mL    Troponin I <0.30  <0.30 ng/mL    Relative Index 1.5  0.0 - 2.5   LIPID PANEL     Status: Abnormal   Collection Time   12/23/11  6:02 AM      Component Value Range Comment   Cholesterol 135  0 - 200 mg/dL    Triglycerides 096 (*) <150 mg/dL    HDL 44  >04 mg/dL    Total CHOL/HDL Ratio 3.1      VLDL 31  0 - 40 mg/dL    LDL Cholesterol 60  0 - 99 mg/dL   CARDIAC PANEL(CRET KIN+CKTOT+MB+TROPI)     Status: Abnormal   Collection Time   12/23/11  6:55 AM      Component Value Range Comment   Total CK 222  7 - 232 U/L    CK, MB 4.7 (*) 0.3 - 4.0 ng/mL    Troponin I <0.30  <0.30 ng/mL    Relative Index 2.1  0.0 - 2.5  URINALYSIS, ROUTINE W REFLEX MICROSCOPIC     Status: Abnormal   Collection Time   12/23/11  8:47 AM      Component Value Range Comment   Color, Urine YELLOW  YELLOW    APPearance CLEAR  CLEAR    Specific Gravity, Urine 1.015  1.005 - 1.030    pH 6.0  5.0 - 8.0    Glucose, UA NEGATIVE  NEGATIVE mg/dL    Hgb urine dipstick TRACE (*) NEGATIVE    Bilirubin Urine NEGATIVE  NEGATIVE    Ketones, ur NEGATIVE  NEGATIVE mg/dL    Protein, ur NEGATIVE  NEGATIVE mg/dL    Urobilinogen, UA 0.2  0.0 - 1.0 mg/dL    Nitrite NEGATIVE  NEGATIVE    Leukocytes, UA NEGATIVE  NEGATIVE   URINE  MICROSCOPIC-ADD ON     Status: Normal   Collection Time   12/23/11  8:47 AM      Component Value Range Comment   RBC / HPF 0-2  <3 RBC/hpf     IMAGING: Dg Chest Portable 1 View  12/22/2011  *RADIOLOGY REPORT*  Clinical Data: Intermittent chest pain.  Coronary artery disease. Smoking history.  PORTABLE CHEST - 1 VIEW  Comparison: 11/06/2009  Findings: Artifact overlies chest.  Heart size is normal. Mediastinal shadows are normal.  Lungs clear.  No effusions.  No bony abnormalities.  IMPRESSION: No active disease  Original Report Authenticated By: Thomasenia Sales, M.D.    IMPRESSION: 1. Atypical chest pain in setting of known CAD his enzymes are negative and his EKG shows no acute changes. I suspect this is related to anxiety plus or minus GERD but cannot completely exclude ischemic chest pain.  RECOMMENDATION: 1. Recommend beginning a long-acting oral nitrate such as Imdur 30 mg by mouth daily. He can be discharged home and I will order an outpatient exercise myocardial perfusion study next week to rule out an ischemic etiology.  Time Spent Directly with Patient: 30 minutes  Pailynn Vahey J 12/23/2011, 12:10 PM

## 2011-12-23 NOTE — Progress Notes (Signed)
UR complete 

## 2011-12-23 NOTE — Discharge Instructions (Signed)
Chest Pain (Nonspecific) It is often hard to give a specific diagnosis for the cause of chest pain. There is always a chance that your pain could be related to something serious, such as a heart attack or a blood clot in the lungs. You need to follow up with your caregiver for further evaluation. CAUSES   Heartburn.   Pneumonia or bronchitis.   Anxiety or stress.   Inflammation around your heart (pericarditis) or lung (pleuritis or pleurisy).   A blood clot in the lung.   A collapsed lung (pneumothorax). It can develop suddenly on its own (spontaneous pneumothorax) or from injury (trauma) to the chest.   Shingles infection (herpes zoster virus).  The chest wall is composed of bones, muscles, and cartilage. Any of these can be the source of the pain.  The bones can be bruised by injury.   The muscles or cartilage can be strained by coughing or overwork.   The cartilage can be affected by inflammation and become sore (costochondritis).  DIAGNOSIS  Lab tests or other studies, such as X-rays, electrocardiography, stress testing, or cardiac imaging, may be needed to find the cause of your pain.  TREATMENT   Treatment depends on what may be causing your chest pain. Treatment may include:   Acid blockers for heartburn.   Anti-inflammatory medicine.   Pain medicine for inflammatory conditions.   Antibiotics if an infection is present.   You may be advised to change lifestyle habits. This includes stopping smoking and avoiding alcohol, caffeine, and chocolate.   You may be advised to keep your head raised (elevated) when sleeping. This reduces the chance of acid going backward from your stomach into your esophagus.   Most of the time, nonspecific chest pain will improve within 2 to 3 days with rest and mild pain medicine.  HOME CARE INSTRUCTIONS   If antibiotics were prescribed, take your antibiotics as directed. Finish them even if you start to feel better.   For the next few  days, avoid physical activities that bring on chest pain. Continue physical activities as directed.   Do not smoke.   Avoid drinking alcohol.   Only take over-the-counter or prescription medicine for pain, discomfort, or fever as directed by your caregiver.   Follow your caregiver's suggestions for further testing if your chest pain does not go away.   Keep any follow-up appointments you made. If you do not go to an appointment, you could develop lasting (chronic) problems with pain. If there is any problem keeping an appointment, you must call to reschedule.  SEEK MEDICAL CARE IF:   You think you are having problems from the medicine you are taking. Read your medicine instructions carefully.   Your chest pain does not go away, even after treatment.   You develop a rash with blisters on your chest.  SEEK IMMEDIATE MEDICAL CARE IF:   You have increased chest pain or pain that spreads to your arm, neck, jaw, back, or abdomen.   You develop shortness of breath, an increasing cough, or you are coughing up blood.   You have severe back or abdominal pain, feel nauseous, or vomit.   You develop severe weakness, fainting, or chills.   You have a fever.  THIS IS AN EMERGENCY. Do not wait to see if the pain will go away. Get medical help at once. Call your local emergency services (911 in [place] Do not drive yourself to the hospital. MAKE SURE YOU:   Understand these instructions.  Will watch your condition.   Will get help right away if you are not doing well or get worse.  Document Released: 03/10/2005 Document Revised: Isosorbide Mononitrate tablets What is this medicine? ISOSORBIDE MONONITRATE (eye soe SOR bide mon oh NYE trate) is a type of vasodilator. It relaxes blood vessels, increasing the blood and oxygen supply to your heart. This medicine is used to prevent chest pain caused by angina. It will not help to stop an episode of chest pain. This medicine may be used for other  purposes; ask your health care provider or pharmacist if you have questions. What should I tell my health care provider before I take this medicine? They need to know if you have any of these conditions: -previous heart attack or heart failure -an unusual or allergic reaction to isosorbide mononitrate, nitrates, other medicines, foods, dyes, or preservatives -pregnant or trying to get pregnant -breast-feeding How should I use this medicine? Take this medicine by mouth with a glass of water. Follow the directions on the prescription label. Take your medicine at regular intervals. Do not take your medicine more often than directed. Do not stop taking this medicine suddenly or your symptoms may get worse. Ask your doctor or health care professional how to gradually reduce the dose. Talk to your pediatrician regarding the use of this medicine in children. Special care may be needed. Overdosage: If you think you have taken too much of this medicine contact a poison control center or emergency room at once. NOTE: This medicine is only for you. Do not share this medicine with others. What if I miss a dose? If you miss a dose, take it as soon as you can. If it is almost time for your next dose, take only that dose. Do not take double or extra doses. What may interact with this medicine? Do not take this medicine with any of the following medications: -medicines used to treat erectile dysfunction (ED) like sildenafil, tadalafil, and vardenafil This medicine may also interact with the following medications: -medicines for high blood pressure -other medicines for angina or heart failure This list may not describe all possible interactions. Give your health care provider a list of all the medicines, herbs, non-prescription drugs, or dietary supplements you use. Also tell them if you smoke, drink alcohol, or use illegal drugs. Some items may interact with your medicine. What should I watch for while using  this medicine? Check your heart rate and blood pressure regularly while you are taking this medicine. Ask your doctor or health care professional what your heart rate and blood pressure should be and when you should contact him or her. Tell your doctor or health care professional if you feel your medicine is no longer working. You may get dizzy. Do not drive, use machinery, or do anything that needs mental alertness until you know how this medicine affects you. To reduce the risk of dizzy or fainting spells, do not sit or stand up quickly, especially if you are an older patient. Alcohol can make you more dizzy, and increase flushing and rapid heartbeats. Avoid alcoholic drinks. Do not treat yourself for coughs, colds, or pain while you are taking this medicine without asking your doctor or health care professional for advice. Some ingredients may increase your blood pressure. What side effects may I notice from receiving this medicine? Side effects that you should report to your doctor or health care professional as soon as possible: -bluish discoloration of lips, fingernails, or palms of hands -  irregular heartbeat, palpitations -low blood pressure -nausea, vomiting -persistent headache -unusually weak or tired Side effects that usually do not require medical attention (report to your doctor or health care professional if they continue or are bothersome): -flushing of the face or neck -rash This list may not describe all possible side effects. Call your doctor for medical advice about side effects. You may report side effects to FDA at 1-800-FDA-1088. Where should I keep my medicine? Keep out of the reach of children. Store between 15 and 30 degrees C (59 and 86 degrees F). Keep container tightly closed. Throw away any unused medicine after the expiration date. NOTE: This sheet is a summary. It may not cover all possible information. If you have questions about this medicine, talk to your doctor,  pharmacist, or health care provider.  2012, Elsevier/[employer] Standard. (01/06/2009 3:00:35 PM)Isosorbide Mononitrate tablets What is this medicine? ISOSORBIDE MONONITRATE (eye soe SOR bide mon oh NYE trate) is a type of vasodilator. It relaxes blood vessels, increasing the blood and oxygen supply to your heart. This medicine is used to prevent chest pain caused by angina. It will not help to stop an episode of chest pain. This medicine may be used for other purposes; ask your health care provider or pharmacist if you have questions. What should I tell my health care provider before I take this medicine? They need to know if you have any of these conditions: -previous heart attack or heart failure -an unusual or allergic reaction to isosorbide mononitrate, nitrates, other medicines, foods, dyes, or preservatives -pregnant or trying to get pregnant -breast-feeding How should I use this medicine? Take this medicine by mouth with a glass of water. Follow the directions on the prescription label. Take your medicine at regular intervals. Do not take your medicine more often than directed. Do not stop taking this medicine suddenly or your symptoms may get worse. Ask your doctor or health care professional how to gradually reduce the dose. Talk to your pediatrician regarding the use of this medicine in children. Special care may be needed. Overdosage: If you think you have taken too much of this medicine contact a poison control center or emergency room at once. NOTE: This medicine is only for you. Do not share this medicine with others. What if I miss a dose? If you miss a dose, take it as soon as you can. If it is almost time for your next dose, take only that dose. Do not take double or extra doses. What may interact with this medicine? Do not take this medicine with any of the following medications: -medicines used to treat erectile dysfunction (ED) like sildenafil, tadalafil, and vardenafil This  medicine may also interact with the following medications: -medicines for high blood pressure -other medicines for angina or heart failure This list may not describe all possible interactions. Give your health care provider a list of all the medicines, herbs, non-prescription drugs, or dietary supplements you use. Also tell them if you smoke, drink alcohol, or use illegal drugs. Some items may interact with your medicine. What should I watch for while using this medicine? Check your heart rate and blood pressure regularly while you are taking this medicine. Ask your doctor or health care professional what your heart rate and blood pressure should be and when you should contact him or her. Tell your doctor or health care professional if you feel your medicine is no longer working. You may get dizzy. Do not drive, use machinery, or do anything  that needs mental alertness until you know how this medicine affects you. To reduce the risk of dizzy or fainting spells, do not sit or stand up quickly, especially if you are an older patient. Alcohol can make you more dizzy, and increase flushing and rapid heartbeats. Avoid alcoholic drinks. Do not treat yourself for coughs, colds, or pain while you are taking this medicine without asking your doctor or health care professional for advice. Some ingredients may increase your blood pressure. What side effects may I notice from receiving this medicine? Side effects that you should report to your doctor or health care professional as soon as possible: -bluish discoloration of lips, fingernails, or palms of hands -irregular heartbeat, palpitations -low blood pressure -nausea, vomiting -persistent headache -unusually weak or tired Side effects that usually do not require medical attention (report to your doctor or health care professional if they continue or are bothersome): -flushing of the face or neck -rash This list may not describe all possible side effects.  Call your doctor for medical advice about side effects. You may report side effects to FDA at 1-800-FDA-1088. Where should I keep my medicine? Keep out of the reach of children. Store between 15 and 30 degrees C (59 and 86 degrees F). Keep container tightly closed. Throw away any unused medicine after the expiration date. NOTE: This sheet is a summary. It may not cover all possible information. If you have questions about this medicine, talk to your doctor, pharmacist, or health care provider.  2012, Elsevier/Gold Standard. (01/06/2009 3:00:35 PM)05/20/2011 Document Reviewed: 01/04/2008 ExitCare Patient Information 2012 ExitCare, LLCIsosorbide Mononitrate tablets What is this medicine? ISOSORBIDE MONONITRATE (eye soe SOR bide mon oh NYE trate) is a type of vasodilator. It relaxes blood vessels, increasing the blood and oxygen supply to your heart. This medicine is used to prevent chest pain caused by angina. It will not help to stop an episode of chest pain. This medicine may be used for other purposes; ask your health care provider or pharmacist if you have questions. What should I tell my health care provider before I take this medicine? They need to know if you have any of these conditions: -previous heart attack or heart failure -an unusual or allergic reaction to isosorbide mononitrate, nitrates, other medicines, foods, dyes, or preservatives -pregnant or trying to get pregnant -breast-feeding How should I use this medicine? Take this medicine by mouth with a glass of water. Follow the directions on the prescription label. Take your medicine at regular intervals. Do not take your medicine more often than directed. Do not stop taking this medicine suddenly or your symptoms may get worse. Ask your doctor or health care professional how to gradually reduce the dose. Talk to your pediatrician regarding the use of this medicine in children. Special care may be needed. Overdosage: If you think  you have taken too much of this medicine contact a poison control center or emergency room at once. NOTE: This medicine is only for you. Do not share this medicine with others. What if I miss a dose? If you miss a dose, take it as soon as you can. If it is almost time for your next dose, take only that dose. Do not take double or extra doses. What may interact with this medicine? Do not take this medicine with any of the following medications: -medicines used to treat erectile dysfunction (ED) like sildenafil, tadalafil, and vardenafil This medicine may also interact with the following medications: -medicines for high blood pressure -other  medicines for angina or heart failure This list may not describe all possible interactions. Give your health care provider a list of all the medicines, herbs, non-prescription drugs, or dietary supplements you use. Also tell them if you smoke, drink alcohol, or use illegal drugs. Some items may interact with your medicine. What should I watch for while using this medicine? Check your heart rate and blood pressure regularly while you are taking this medicine. Ask your doctor or health care professional what your heart rate and blood pressure should be and when you should contact him or her. Tell your doctor or health care professional if you feel your medicine is no longer working. You may get dizzy. Do not drive, use machinery, or do anything that needs mental alertness until you know how this medicine affects you. To reduce the risk of dizzy or fainting spells, do not sit or stand up quickly, especially if you are an older patient. Alcohol can make you more dizzy, and increase flushing and rapid heartbeats. Avoid alcoholic drinks. Do not treat yourself for coughs, colds, or pain while you are taking this medicine without asking your doctor or health care professional for advice. Some ingredients may increase your blood pressure. What side effects may I notice from  receiving this medicine? Side effects that you should report to your doctor or health care professional as soon as possible: -bluish discoloration of lips, fingernails, or palms of hands -irregular heartbeat, palpitations -low blood pressure -nausea, vomiting -persistent headache -unusually weak or tired Side effects that usually do not require medical attention (report to your doctor or health care professional if they continue or are bothersome): -flushing of the face or neck -rash This list may not describe all possible side effects. Call your doctor for medical advice about side effects. You may report side effects to FDA at 1-800-FDA-1088. Where should I keep my medicine? Keep out of the reach of children. Store between 15 and 30 degrees C (59 and 86 degrees F). Keep container tightly closed. Throw away any unused medicine after the expiration date. NOTE: This sheet is a summary. It may not cover all possible information. If you have questions about this medicine, talk to your doctor, pharmacist, or health care provider.  2012, Elsevier/Gold Standard. (01/06/2009 3:00:35 PM).

## 2012-01-09 ENCOUNTER — Ambulatory Visit (INDEPENDENT_AMBULATORY_CARE_PROVIDER_SITE_OTHER): Payer: Managed Care, Other (non HMO) | Admitting: Emergency Medicine

## 2012-01-09 ENCOUNTER — Ambulatory Visit: Payer: Managed Care, Other (non HMO)

## 2012-01-09 VITALS — BP 122/70 | HR 62 | Temp 97.5°F | Resp 18 | Ht 68.0 in | Wt 217.0 lb

## 2012-01-09 DIAGNOSIS — M25519 Pain in unspecified shoulder: Secondary | ICD-10-CM

## 2012-01-09 MED ORDER — MELOXICAM 7.5 MG PO TABS: ORAL_TABLET | ORAL | Status: DC

## 2012-01-09 NOTE — Patient Instructions (Signed)
Rotator Cuff Injury  The rotator cuff is the collective set of muscles and tendons that make up the stabilizing unit of your shoulder. This unit holds in the ball of the humerus (upper arm bone) in the socket of the scapula (shoulder blade). Injuries to this stabilizing unit most commonly come from sports or activities that cause the arm to be moved repeatedly over the head. Examples of this include throwing, weight lifting, swimming, racquet sports, or an injury such as falling on your arm. Chronic (longstanding) irritation of this unit can cause inflammation (soreness), bursitis, and eventual damage to the tendons to the point of rupture (tear). An acute (sudden) injury of the rotator cuff can result in a partial or complete tear. You may need surgery with complete tears. Small or partial rotator cuff tears may be treated conservatively with temporary immobilization, exercises and rest. Physical therapy may be needed.  HOME CARE INSTRUCTIONS    Apply ice to the injury for 15 to 20 minutes 3 to 4 times per day for the first 2 days. Put the ice in a plastic bag and place a towel between the bag of ice and your skin.   If you have a shoulder immobilizer (sling and straps), do not remove it for as long as directed by your caregiver or until you see a caregiver for a follow-up examination. If you need to remove it, move your arm as little as possible.   You may want to sleep on several pillows or in a recliner at night to lessen swelling and pain.   Only take over-the-counter or prescription medicines for pain, discomfort, or fever as directed by your caregiver.   Do simple hand squeezing exercises with a soft rubber ball to decrease hand swelling.  SEEK MEDICAL CARE IF:    Pain in your shoulder increases or new pain or numbness develops in your arm, hand, or fingers.   Your hand or fingers are colder than your other hand.  SEEK IMMEDIATE MEDICAL CARE IF:    Your arm, hand, or fingers are numb or  tingling.   Your arm, hand, or fingers are increasingly swollen and painful, or turn white or blue.  Document Released: 05/28/2000 Document Revised: 05/20/2011 Document Reviewed: 05/21/2008  [employer] Patient Information 2012 [employer].

## 2012-01-09 NOTE — Progress Notes (Signed)
  Subjective:    Patient ID: Maurice Duffy, male    DOB: 11/19/1950, 61 y.o.   MRN: [id number]  HPI patient here with pain and discomfort in his right shoulder. Patient was lifting weights and developed pain in his right shoulder while doing bench presses. He now has pain when he rotates the right shoulder.    Review of Systems     Objective:   Physical Exam there is decreased range of motion of the right shoulder. There is pain with internal and external rotation. He has significant pain with external rotation against resistance. There is evidence of a previous rupture of the left biceps muscle.  [facility] reading (PRIMARY) by  Dr. Cleta Alberts  shoulder films are normal.        Assessment & Plan:  I suspect this is a rotator cuff injury to the right shoulder. It does not sound like the humeral head subluxed out when he was lifting but that is a possibility. I suspect this is a rotator cuff strain we'll place on anti-inflammatories.

## 2012-01-10 IMAGING — CR DG CHEST 1V PORT
1 series · 1 of 1 positions shown · non-contrast
Comparison: None.

CLINICAL DATA: 2-week history of chest pain radiating into the left
arm.  History of hypertension.  Smoker.

PORTABLE CHEST - 1 VIEW [DATE]/[id number] hours:

[view not recorded]
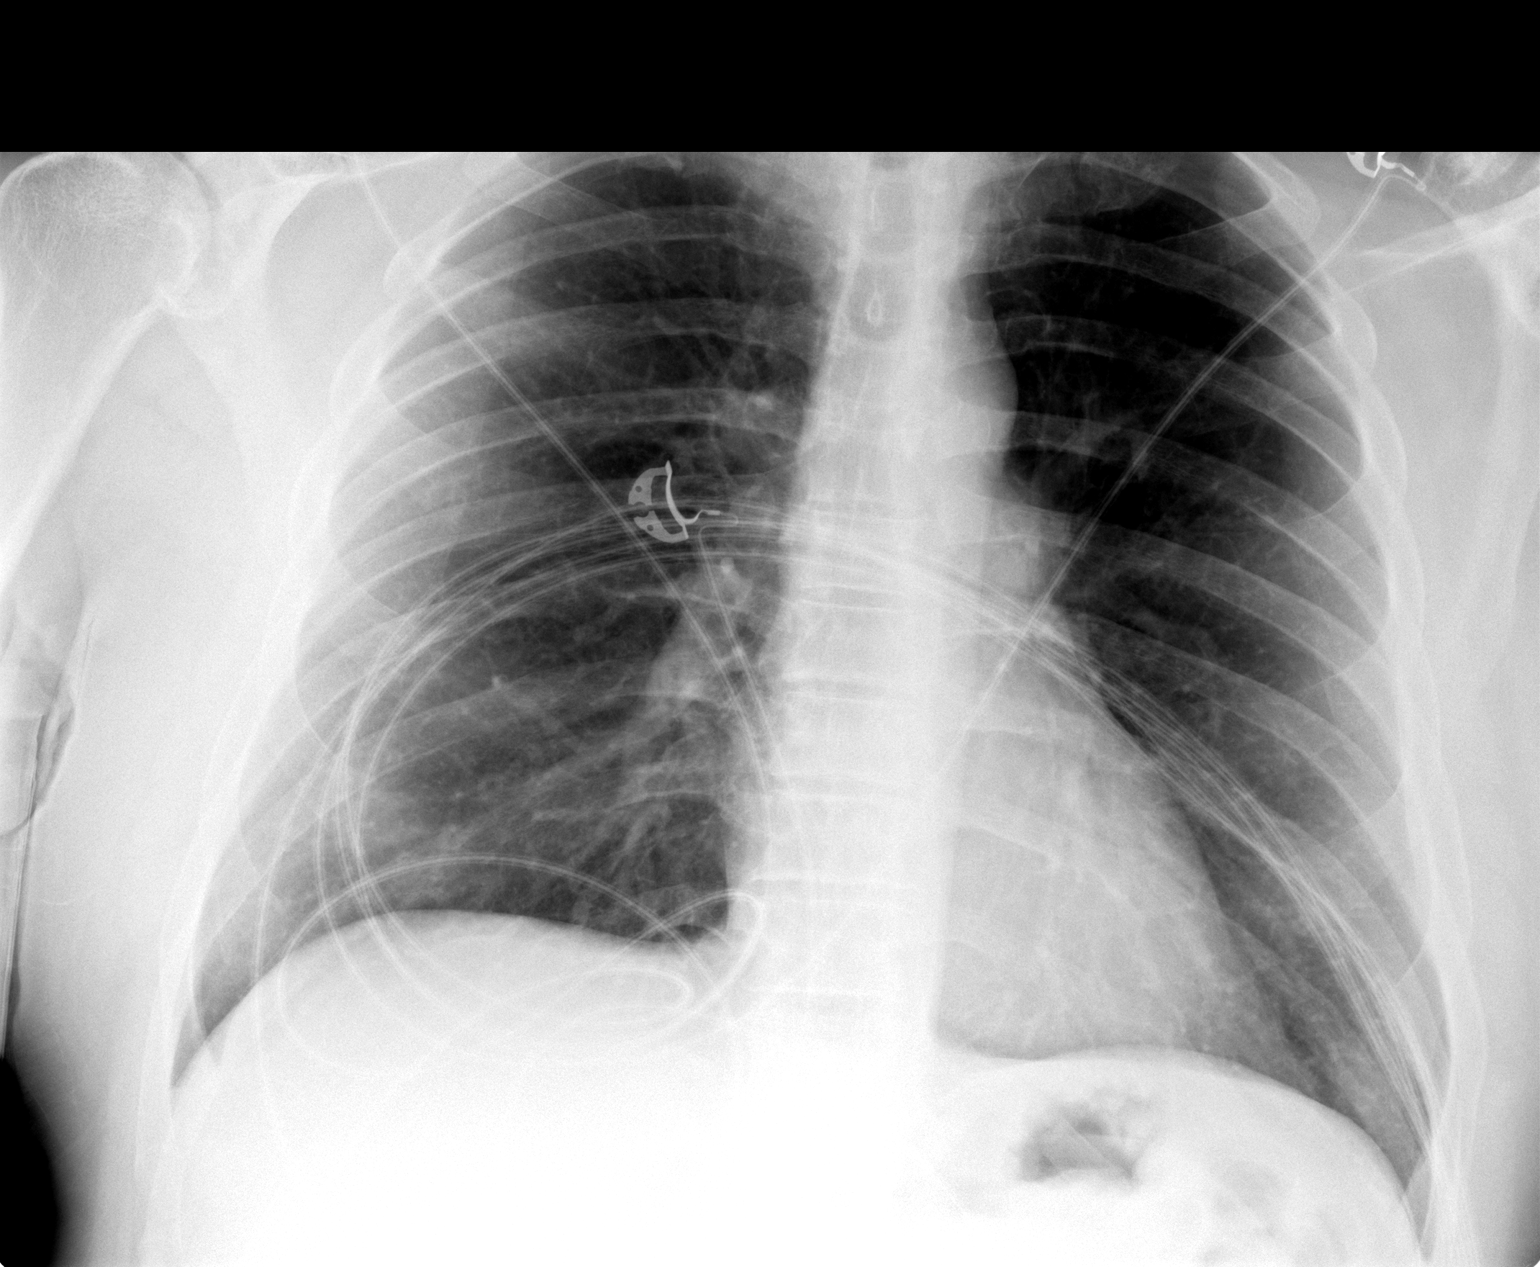

[1 of 1 positions shown; findings below may reference images not displayed]

FINDINGS: Heart size normal and mediastinal contours unremarkable
for the AP portable technique.  Lungs clear.  Pulmonary vascularity
normal.  Bronchovascular markings normal.  No pleural effusions.
IMPRESSION: No acute cardiopulmonary disease.

## 2012-01-22 IMAGING — CR DG CHEST 1V PORT
1 series · 1 of 1 positions shown · non-contrast
Comparison: 10/25/2009

CLINICAL DATA: Chest pain.

PORTABLE CHEST - 1 VIEW

[view not recorded]
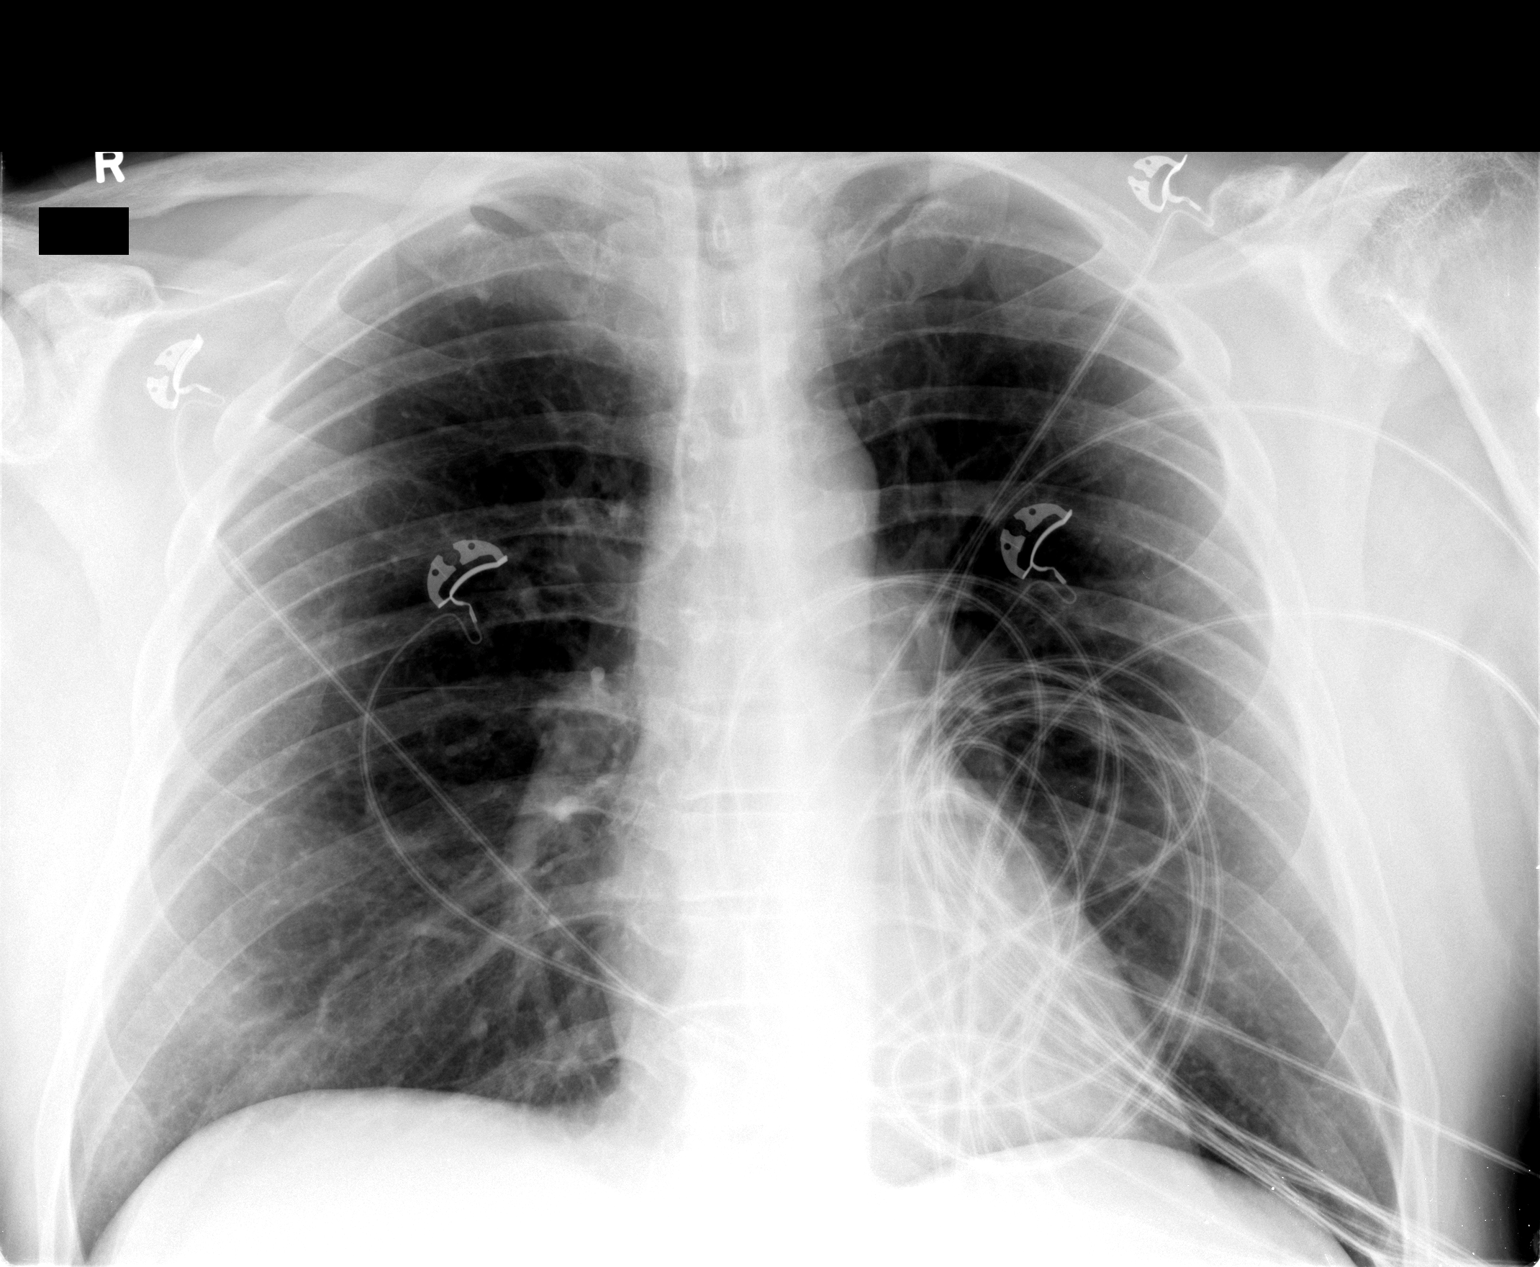

[1 of 1 positions shown; findings below may reference images not displayed]

FINDINGS: Heart and mediastinal contours are within normal limits.
No focal opacities or effusions.  No acute bony abnormality.
IMPRESSION: No acute cardiopulmonary disease.

## 2012-02-28 ENCOUNTER — Encounter: Payer: Self-pay | Admitting: *Deleted

## 2012-02-28 DIAGNOSIS — I2510 Atherosclerotic heart disease of native coronary artery without angina pectoris: Secondary | ICD-10-CM

## 2012-06-28 ENCOUNTER — Ambulatory Visit (INDEPENDENT_AMBULATORY_CARE_PROVIDER_SITE_OTHER): Payer: Managed Care, Other (non HMO) | Admitting: Physician Assistant

## 2012-06-28 VITALS — BP 125/84 | HR 63 | Temp 97.9°F | Resp 18 | Ht 70.0 in | Wt 209.0 lb

## 2012-06-28 DIAGNOSIS — H60399 Other infective otitis externa, unspecified ear: Secondary | ICD-10-CM

## 2012-06-28 DIAGNOSIS — H6090 Unspecified otitis externa, unspecified ear: Secondary | ICD-10-CM

## 2012-06-28 MED ORDER — CIPROFLOXACIN-DEXAMETHASONE 0.3-0.1 % OT SUSP: 4.0000 [drp] | Freq: Two times a day (BID) | OTIC | Status: DC

## 2012-06-28 MED ORDER — AMOXICILLIN 500 MG PO CAPS: 1000.0000 mg | ORAL_CAPSULE | Freq: Three times a day (TID) | ORAL | Status: DC

## 2012-06-28 MED ORDER — HYDROCODONE-ACETAMINOPHEN 5-325 MG PO TABS: 1.0000 | ORAL_TABLET | Freq: Four times a day (QID) | ORAL | Status: DC | PRN

## 2012-06-28 NOTE — Progress Notes (Signed)
  Subjective:    Patient ID: LANSING SIGMON, male    DOB: 14-Aug-1950, 62 y.o.   MRN: [id number]  HPI   Mr. Sinclair is a pleasant 62 yr old male here complaining of left ear pain.  States it began about 5-6 days ago, worsening over the last 2-3 days.  Beginning to have decreased hearing in the left ear.  States about 3 months ago he had a problem with this ear, a "cellulitis" but states it was confined to the ear canal.  Denies involvement of the external ear/face/neck.  Was seen at a different urgent care and treated with amox/clav and tmp/smx.  Denies nasal symptoms, sore throat, HA, cough, fever or chills.  Some radiation of pain into the left side of his jaw.  Tried tylenol for pain but this didn't quite take the edge off. Asks if there is anything we can do for pain as he is really hurting.     Review of Systems  Constitutional: Negative for fever and chills.  HENT: Positive for ear pain and ear discharge. Negative for congestion, sore throat, rhinorrhea, postnasal drip and sinus pressure.   Respiratory: Negative.   Cardiovascular: Negative.   Gastrointestinal: Negative.   Musculoskeletal: Negative.   Neurological: Negative.  Negative for dizziness and headaches.       Objective:   Physical Exam  Vitals reviewed. Constitutional: He is oriented to person, place, and time. He appears well-developed and well-nourished. No distress.       Tardive movements of face  HENT:  Head: Normocephalic and atraumatic.  Right Ear: Tympanic membrane and ear canal normal.  Left Ear: There is drainage and swelling.       Left EAC erythematous and edematous with purulence; TM difficult to visualize completely, but appears intact  Eyes: Conjunctivae normal are normal. No scleral icterus.  Cardiovascular: Normal rate, regular rhythm and normal heart sounds.  Exam reveals no gallop and no friction rub.   No murmur heard. Pulmonary/Chest: Effort normal and breath sounds normal. He has no wheezes. He has no  rales.  Neurological: He is alert and oriented to person, place, and time.  Skin: Skin is warm and dry.  Psychiatric: He has a normal mood and affect. His behavior is normal.     Filed Vitals:   06/28/12 1451  BP: 125/84  Pulse: 63  Temp: 97.9 F (36.6 C)  Resp: 18        Assessment & Plan:   1. Otitis externa  amoxicillin (AMOXIL) 500 MG capsule, ciprofloxacin-dexamethasone (CIPRODEX) otic suspension, HYDROcodone-acetaminophen (NORCO) 5-325 MG per tablet    Mr. Maack is a very pleasant 62 yr old male with otitis externa.  His history suggests that he has had this in the past.  The ear was gently irrigated and a small amount of debris was removed.  Will treat with Ciprodex BID x 7 days.  TM is difficult to visualize.  I am uncertain whether he may also have an otitis media as well.  Will treat with PO amoxicillin as well.  Norco for pain relief if needed.  Encouraged pt to keep ear dry as much as possible.  Discussed RTC precautions.  Discussed with pt that if symptoms do not improve of if he has recurrent infection, we may need to consider ENT evaluation.  Pt understands and is agreement with this plan.

## 2012-06-28 NOTE — Patient Instructions (Addendum)
Begin using Ciprodex drops today, continue for 7 days.  Begin the amoxicillin tonight as well.  Be sure to finish the full course.  Keep the ear dry as much as possible, occlude the ear with cotton when you shower.  Use Norco if needed for pain.  Please let us know if you are worsening or not improving.  If things are not improving we may consider referring you to ENT,   Otitis Externa Otitis externa is a bacterial or fungal infection of the outer ear canal. This is the area from the eardrum to the outside of the ear. Otitis externa is sometimes called "swimmer's ear." CAUSES  Possible causes of infection include:  Swimming in dirty water.  Moisture remaining in the ear after swimming or bathing.  Mild injury (trauma) to the ear.  Objects stuck in the ear (foreign body).  Cuts or scrapes (abrasions) on the outside of the ear. SYMPTOMS  The first symptom of infection is often itching in the ear canal. Later signs and symptoms may include swelling and redness of the ear canal, ear pain, and yellowish-white fluid (pus) coming from the ear. The ear pain may be worse when pulling on the earlobe. DIAGNOSIS  Your caregiver will perform a physical exam. A sample of fluid may be taken from the ear and examined for bacteria or fungi. TREATMENT  Antibiotic ear drops are often given for 10 to 14 days. Treatment may also include pain medicine or corticosteroids to reduce itching and swelling. PREVENTION   Keep your ear dry. Use the corner of a towel to absorb water out of the ear canal after swimming or bathing.  Avoid scratching or putting objects inside your ear. This can damage the ear canal or remove the protective wax that lines the canal. This makes it easier for bacteria and fungi to grow.  Avoid swimming in lakes, polluted water, or poorly chlorinated pools.  You may use ear drops made of rubbing alcohol and vinegar after swimming. Combine equal parts of white vinegar and alcohol in a  bottle. Put 3 or 4 drops into each ear after swimming. HOME CARE INSTRUCTIONS   Apply antibiotic ear drops to the ear canal as prescribed by your caregiver.  Only take over-the-counter or prescription medicines for pain, discomfort, or fever as directed by your caregiver.  If you have diabetes, follow any additional treatment instructions from your caregiver.  Keep all follow-up appointments as directed by your caregiver. SEEK MEDICAL CARE IF:   You have a fever.  Your ear is still red, swollen, painful, or draining pus after 3 days.  Your redness, swelling, or pain gets worse.  You have a severe headache.  You have redness, swelling, pain, or tenderness in the area behind your ear. MAKE SURE YOU:   Understand these instructions.  Will watch your condition.  Will get help right away if you are not doing well or get worse. Document Released: 05/31/2005 Document Revised: 08/23/2011 Document Reviewed: 06/17/2011 [facility] Patient Information 2013 [place], [place].

## 2012-08-28 ENCOUNTER — Encounter: Payer: Managed Care, Other (non HMO) | Admitting: Physician Assistant

## 2012-08-30 ENCOUNTER — Ambulatory Visit (INDEPENDENT_AMBULATORY_CARE_PROVIDER_SITE_OTHER): Payer: Managed Care, Other (non HMO) | Admitting: Family Medicine

## 2012-08-30 ENCOUNTER — Encounter: Payer: Self-pay | Admitting: Family Medicine

## 2012-08-30 VITALS — BP 124/82 | HR 72 | Temp 97.4°F | Resp 16 | Ht 69.5 in | Wt 209.0 lb

## 2012-08-30 DIAGNOSIS — Z0000 Encounter for general adult medical examination without abnormal findings: Secondary | ICD-10-CM

## 2012-08-30 LAB — POCT URINALYSIS DIPSTICK
Bilirubin, UA: NEGATIVE
Glucose, UA: NEGATIVE
Ketones, UA: NEGATIVE
Leukocytes, UA: NEGATIVE
Nitrite, UA: NEGATIVE
Protein, UA: NEGATIVE
Spec Grav, UA: 1.015
Urobilinogen, UA: 0.2
pH, UA: 7.5

## 2012-08-30 LAB — IFOBT (OCCULT BLOOD): IFOBT: NEGATIVE

## 2012-08-30 NOTE — Progress Notes (Signed)
62 yo man on leave from work because of anxiety.  He has been on leave for 3 weeks and doubts he'll go back:  It's too stressful.  He's seeing a psychiatrist.    Patient ID: Maurice Duffy MRN: [id number], DOB: Apr 21, 1951 62 y.o. Date of Encounter: 08/30/2012, 2:20 PM  Primary Physician: No primary provider on file.  Chief Complaint: Physical (CPE)  HPI: 62 y.o. y/o male with history noted below here for CPE.  62 yo man on leave from work because of anxiety.  He has been on leave for 3 weeks and doubts he'll go back:  It's too stressful.  He's seeing a psychiatrist.   He has not been exercising, but his mood is improving after starting his antidepressants.  He sees "Dr. Mervyn Skeeters" for his mood disorder. He lives with wife (an R.N. Who works for legal aid) and is trained himself as an Astronomer. 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  Review of Systems: Consitutional: No fever, chills, fatigue, night sweats, lymphadenopathy, or weight changes. Eyes: No visual changes, eye redness, or discharge. ENT/Mouth: Ears: No otalgia, tinnitus, hearing loss, discharge. Nose: No congestion, rhinorrhea, sinus pain, or epistaxis. Throat: No sore throat, post nasal drip, or teeth pain. Cardiovascular: No CP, palpitations, diaphoresis, DOE, edema, orthopnea, PND. Respiratory: No cough, hemoptysis, SOB, or wheezing. Gastrointestinal: No anorexia, dysphagia, reflux, pain, nausea, vomiting, hematemesis, diarrhea, constipation, BRBPR, or melena. Genitourinary: No dysuria, frequency, urgency, hematuria, incontinence, nocturia, decreased urinary stream, discharge, impotence, or testicular pain/masses. Musculoskeletal: No decreased ROM, myalgias, stiffness, joint swelling, or weakness. Skin: No rash, erythema, lesion changes, pain, warmth, jaundice, or pruritis. Neurological: No headache, dizziness, syncope, seizures, tremors, memory loss,  coordination problems, or paresthesias. Psychological: No anxiety, depression, hallucinations, SI/HI. Endocrine: No fatigue, polydipsia, polyphagia, polyuria, or known diabetes. All other systems were reviewed and are otherwise negative.  Past Medical History  Diagnosis Date  . Coronary artery disease   . Anxiety   . Depression      Past Surgical History  Procedure Laterality Date  . Coronary angioplasty with stent placement    . Hernia repair      Home Meds:  Prior to Admission medications   Medication Sig Start Date End Date Taking? Authorizing Provider  busPIRone (BUSPAR) 10 MG tablet Take 10 mg by mouth 3 (three) times daily.   Yes Historical Provider, MD  citalopram (CELEXA) 20 MG tablet Take 20 mg by mouth daily.   Yes Historical Provider, MD  clopidogrel (PLAVIX) 75 MG tablet Take 75 mg by mouth daily.   Yes Historical Provider, MD  divalproex (DEPAKOTE ER) 500 MG 24 hr tablet Take 250 mg by mouth at bedtime.    Yes Historical Provider, MD  simvastatin (ZOCOR) 20 MG tablet Take 20 mg by mouth every evening.   Yes Historical Provider, MD  aspirin EC 81 MG EC tablet Take 1 tablet (81 mg total) by mouth daily. 12/23/11 12/22/12  Erick Blinks, MD    Allergies: No Known Allergies  History   Social History  . Marital Status: Married    Spouse Name: N/A    Number of Children: N/A  . Years of Education: N/A   Occupational History  . Not on file.   Social History Main Topics  . Smoking status: Former Smoker -- 1.00 packs/day for 18 years    Quit date: 08/13/2011  . Smokeless tobacco: Not on  file  . Alcohol Use: No  . Drug Use: No  . Sexually Active: Yes   Other Topics Concern  . Not on file   Social History Narrative  . No narrative on file    Family History  Problem Relation Age of Onset  . Coronary artery disease Father 67  . Cancer Father     Physical Exam: Blood pressure 124/82, pulse 72, temperature 97.4 F (36.3 C), resp. rate 16, height 5' 9.5"  (1.765 m), weight 209 lb (94.802 kg).  General: Well developed, well nourished, in no acute distress. HEENT: Normocephalic, atraumatic. Conjunctiva pink, sclera non-icteric. Pupils 2 mm constricting to 1 mm, round, regular, and equally reactive to light and accomodation. EOMI. Internal auditory canal clear. TMs with good cone of light and without pathology. Nasal mucosa pink. Nares are without discharge. No sinus tenderness. Oral mucosa pink. Dentition good. Pharynx without exudate.   Neck: Supple. Trachea midline. No thyromegaly. Full ROM. No lymphadenopathy. Lungs: Clear to auscultation bilaterally without wheezes, rales, or rhonchi. Breathing is of normal effort and unlabored. Cardiovascular: RRR with S1 S2. No murmurs, rubs, or gallops appreciated. Distal pulses 2+ symmetrically. No carotid or abdominal bruits Abdomen: Soft, non-tender, non-distended with normoactive bowel sounds. No hepatosplenomegaly or masses. No rebound/guarding. No CVA tenderness. Without hernias.  Rectal: No external hemorrhoids or fissures. Rectal vault without masses.  Enlarged prostate  Genitourinary:  circumcised male. No penile lesions. Testes descended bilaterally, and smooth without tenderness or masses.  Musculoskeletal: Full range of motion and 5/5 strength throughout. Without swelling, atrophy, tenderness, crepitus, or warmth. Extremities without clubbing, cyanosis, or edema. Calves supple. Skin: Warm and moist without erythema, ecchymosis, wounds, or rash. Neuro: A+Ox3. CN II-XII grossly intact. Moves all extremities spontaneously. Full sensation throughout. Normal gait. DTR 2+ throughout upper and lower extremities. Finger to nose intact. Psych:  Responds to questions appropriately with a normal affect.   UA:  Results for orders placed in visit on 08/30/12  POCT URINALYSIS DIPSTICK      Result Value Range   Color, UA yellow     Clarity, UA clear     Glucose, UA neg     Bilirubin, UA neg     Ketones, UA neg      Spec Grav, UA 1.015     Blood, UA trace-intact     pH, UA 7.5     Protein, UA neg     Urobilinogen, UA 0.2     Nitrite, UA neg     Leukocytes, UA Negative    IFOBT (OCCULT BLOOD)      Result Value Range   IFOBT Negative       Assessment/Plan:  62 y.o. y/o  male here for CPE -Since patient is not fasting, he is given a prescription for fasting labs:  Depakote level, CBC, CMET, Lipids, PSA He'll see Dr. Allyson Sabal tomorrow. Signed, Elvina Sidle, MD 08/30/2012 2:20 PM

## 2012-08-30 NOTE — Patient Instructions (Signed)
Health Maintenance, Males A healthy lifestyle and preventative care can promote health and wellness.  Maintain regular health, dental, and eye exams.  Eat a healthy diet. Foods like vegetables, fruits, whole grains, low-fat dairy products, and lean protein foods contain the nutrients you need without too many calories. Decrease your intake of foods high in solid fats, added sugars, and salt. Get information about a proper diet from your caregiver, if necessary.  Regular physical exercise is one of the most important things you can do for your health. Most adults should get at least 150 minutes of moderate-intensity exercise (any activity that increases your heart rate and causes you to sweat) each week. In addition, most adults need muscle-strengthening exercises on 2 or more days a week.   Maintain a healthy weight. The body mass index (BMI) is a screening tool to identify possible weight problems. It provides an estimate of body fat based on height and weight. Your caregiver can help determine your BMI, and can help you achieve or maintain a healthy weight. For adults 20 years and older:  A BMI below 18.5 is considered underweight.  A BMI of 18.5 to 24.9 is normal.  A BMI of 25 to 29.9 is considered overweight.  A BMI of 30 and above is considered obese.  Maintain normal blood lipids and cholesterol by exercising and minimizing your intake of saturated fat. Eat a balanced diet with plenty of fruits and vegetables. Blood tests for lipids and cholesterol should begin at age 20 and be repeated every 5 years. If your lipid or cholesterol levels are high, you are over 50, or you are a high risk for heart disease, you may need your cholesterol levels checked more frequently.Ongoing high lipid and cholesterol levels should be treated with medicines, if diet and exercise are not effective.  If you smoke, find out from your caregiver how to quit. If you do not use tobacco, do not start.  If you  choose to drink alcohol, do not exceed 2 drinks per day. One drink is considered to be 12 ounces (355 mL) of beer, 5 ounces (148 mL) of wine, or 1.5 ounces (44 mL) of liquor.  Avoid use of street drugs. Do not share needles with anyone. Ask for help if you need support or instructions about stopping the use of drugs.  High blood pressure causes heart disease and increases the risk of stroke. Blood pressure should be checked at least every 1 to 2 years. Ongoing high blood pressure should be treated with medicines if weight loss and exercise are not effective.  If you are 45 to 60 years old, ask your caregiver if you should take aspirin to prevent heart disease.  Diabetes screening involves taking a blood sample to check your fasting blood sugar level. This should be done once every 3 years, after age 45, if you are within normal weight and without risk factors for diabetes. Testing should be considered at a younger age or be carried out more frequently if you are overweight and have at least 1 risk factor for diabetes.  Colorectal cancer can be detected and often prevented. Most routine colorectal cancer screening begins at the age of 50 and continues through age 75. However, your caregiver may recommend screening at an earlier age if you have risk factors for colon cancer. On a yearly basis, your caregiver may provide home test kits to check for hidden blood in the stool. Use of a small camera at the end of a tube,   to directly examine the colon (sigmoidoscopy or colonoscopy), can detect the earliest forms of colorectal cancer. Talk to your caregiver about this at age 50, when routine screening begins. Direct examination of the colon should be repeated every 5 to 10 years through age 75, unless early forms of pre-cancerous polyps or small growths are found.  Hepatitis C blood testing is recommended for all people born from 1945 through 1965 and any individual with known risks for hepatitis C.  Healthy  men should no longer receive prostate-specific antigen (PSA) blood tests as part of routine cancer screening. Consult with your caregiver about prostate cancer screening.  Testicular cancer screening is not recommended for adolescents or adult males who have no symptoms. Screening includes self-exam, caregiver exam, and other screening tests. Consult with your caregiver about any symptoms you have or any concerns you have about testicular cancer.  Practice safe sex. Use condoms and avoid high-risk sexual practices to reduce the spread of sexually transmitted infections (STIs).  Use sunscreen with a sun protection factor (SPF) of 30 or greater. Apply sunscreen liberally and repeatedly throughout the day. You should seek shade when your shadow is shorter than you. Protect yourself by wearing long sleeves, pants, a wide-brimmed hat, and sunglasses year round, whenever you are outdoors.  Notify your caregiver of new moles or changes in moles, especially if there is a change in shape or color. Also notify your caregiver if a mole is larger than the size of a pencil eraser.  A one-time screening for abdominal aortic aneurysm (AAA) and surgical repair of large AAAs by sound wave imaging (ultrasonography) is recommended for ages 65 to 75 years who are current or former smokers.  Stay current with your immunizations. Document Released: 11/27/2007 Document Revised: 08/23/2011 Document Reviewed: 10/26/2010 [employer] Patient Information 2013 [employer].  

## 2012-11-21 ENCOUNTER — Telehealth: Payer: Self-pay | Admitting: Cardiovascular Disease

## 2012-11-21 NOTE — Telephone Encounter (Signed)
Pt is applying for life insurance and has questions about his medications.

## 2012-11-21 NOTE — Telephone Encounter (Signed)
i reviewed patient's chart.  It appears that imdur was started July 2013 at hospital discharge.  i spoke with patient and gave him that date

## 2012-11-21 NOTE — Telephone Encounter (Signed)
Returned call.  Pt wants to know start date of Imdur.  Pt informed per med list, he is not taking Imdur and it has not been on his med list from May 2011 to present except in October 2013 where it states he is not taking.  OV notes and flow sheets reviewed and RN unable to find where Imdur was started or on med list.  Pt informed and verbalized understanding.  Pt would like K. Petra Kuba, RN for Dr. Allyson Sabal to review chart and call him back.  Pt verbalized understanding and agreed w/ plan.  Paper chart# [id number] on K. Vogel's desk.  Message forwarded to K. Petra Kuba, Charity fundraiser.

## 2013-02-08 ENCOUNTER — Other Ambulatory Visit: Payer: Self-pay | Admitting: *Deleted

## 2013-02-08 MED ORDER — CLOPIDOGREL BISULFATE 75 MG PO TABS: 75.0000 mg | ORAL_TABLET | Freq: Every day | ORAL | Status: DC

## 2013-02-08 MED ORDER — SIMVASTATIN 20 MG PO TABS: 20.0000 mg | ORAL_TABLET | Freq: Every evening | ORAL | Status: DC

## 2013-02-08 NOTE — Telephone Encounter (Signed)
Rx was sent to pharmacy electronically. 

## 2013-04-26 ENCOUNTER — Other Ambulatory Visit: Payer: Self-pay | Admitting: *Deleted

## 2013-04-26 ENCOUNTER — Ambulatory Visit (INDEPENDENT_AMBULATORY_CARE_PROVIDER_SITE_OTHER): Payer: No Typology Code available for payment source | Admitting: Physician Assistant

## 2013-04-26 DIAGNOSIS — H9209 Otalgia, unspecified ear: Secondary | ICD-10-CM

## 2013-04-26 DIAGNOSIS — H6690 Otitis media, unspecified, unspecified ear: Secondary | ICD-10-CM

## 2013-04-26 DIAGNOSIS — H9201 Otalgia, right ear: Secondary | ICD-10-CM

## 2013-04-26 MED ORDER — OFLOXACIN 0.3 % OT SOLN: 10.0000 [drp] | Freq: Every day | OTIC | Status: DC

## 2013-04-26 MED ORDER — AMOXICILLIN 875 MG PO TABS: 875.0000 mg | ORAL_TABLET | Freq: Two times a day (BID) | ORAL | Status: DC

## 2013-04-26 MED ORDER — OFLOXACIN 0.3 % OT SOLN: 10.0000 [drp] | Freq: Every day | OTIC | Status: AC

## 2013-04-26 NOTE — Progress Notes (Signed)
    Subjective:    Patient ID: Maurice Duffy, male    DOB: 01/23/1951, 62 y.o.   MRN: [id number]  HPI Pt presents to clinic with right ear pain.  He started with left ear pain about 2 weeks ago and now the pain has moved to the right side with some drainage from the ear that is cloudy yellow.  He is having muffled hearing.  He is having no cold symptoms, congestion or cough. No fevers or chills.  He has had no trauma to his ear.  Review of Systems  Constitutional: Negative.   HENT: Positive for ear discharge and ear pain. Negative for congestion, postnasal drip and rhinorrhea.   Respiratory: Negative.        Objective:   Physical Exam  Vitals reviewed. Constitutional: He is oriented to person, place, and time. He appears well-developed and well-nourished.  HENT:  Head: Normocephalic and atraumatic.  Right Ear: Hearing and ear canal normal. There is drainage (cloudy yellow drainage - TM is not visible due to drainage).  Left Ear: Hearing, external ear and ear canal normal. Tympanic membrane is retracted. Tympanic membrane is not erythematous.  Nose: Nose normal.  Mouth/Throat: Uvula is midline, oropharynx is clear and moist and mucous membranes are normal.  Eyes: Conjunctivae are normal.  Cardiovascular: Normal rate, regular rhythm and normal heart sounds.   No murmur heard. Pulmonary/Chest: Effort normal and breath sounds normal. He has no wheezes.  Neurological: He is alert and oriented to person, place, and time.  Skin: Skin is warm and dry.  Psychiatric: He has a normal mood and affect. His behavior is normal. Judgment and thought content normal.      Assessment & Plan:  AOM (acute otitis media), right - Plan: amoxicillin (AMOXIL) 875 MG tablet, ofloxacin (FLOXIN) 0.3 % otic solution  Ear pain, right  Pt to use Tylenol/motrin prn for pain.  If his hearing has not returned in 2-4 weeks he should RTC for recheck and to visualized the TM - he may need a hearing test if TMs are  normal but not improved hearing.  Benny Lennert PA-C 04/26/2013 7:01 PM

## 2013-05-06 ENCOUNTER — Ambulatory Visit (INDEPENDENT_AMBULATORY_CARE_PROVIDER_SITE_OTHER): Payer: No Typology Code available for payment source | Admitting: Family Medicine

## 2013-05-06 ENCOUNTER — Telehealth: Payer: Self-pay | Admitting: Family Medicine

## 2013-05-06 DIAGNOSIS — H6690 Otitis media, unspecified, unspecified ear: Secondary | ICD-10-CM

## 2013-05-06 MED ORDER — AMOXICILLIN 875 MG PO TABS: 875.0000 mg | ORAL_TABLET | Freq: Two times a day (BID) | ORAL | Status: DC

## 2013-05-06 MED ORDER — OFLOXACIN 0.3 % OT SOLN: 10.0000 [drp] | Freq: Two times a day (BID) | OTIC | Status: DC

## 2013-05-06 NOTE — Progress Notes (Signed)
Subjective:    Patient ID: Maurice Duffy, male    DOB: Jun 03, 1951, 62 y.o.   MRN: [id number] This chart was scribed for Maurice Staggers, MD by Valera Castle, ED Scribe. This patient was seen in room 2 and the patient's care was started at 4:46 PM.  HPI Maurice Duffy is a 62 y.o. male Seen by Dr. Benny Lennert 10 days ago, diagnosed with right acute otitis media. He was given Amoxicillin 825 mg BIB and Floxin Otic. Noted to have cloudy yellow drainage at last visit and unable to visualize TM due to drainage of right ear.   He reports currently being unable to hear out of his right ear due to it being stopped up, but denies any current pain to his right ear. He reports some minimal clear, drainge from his ear occasionally. He denies any blood in the discharge. He reports he is still taking Amoxicillin and still has 2 days left with his pills. He reports he finished his ear drops 3 days ago. He reports he is eating and drinking normally. He denies fever, cough, congestion, rhinorrhea, and any other associated symptoms.   PCP - No primary provider on file.  Patient Active Problem List   Diagnosis Date Noted   Anxiety attack 12/22/2011   Bipolar 2 disorder 12/22/2011   Chest pain 12/22/2011   Obesity 12/22/2011   CAD (coronary artery disease) 12/22/2011   Past Medical History  Diagnosis Date   Coronary artery disease    Anxiety    Depression    Past Surgical History  Procedure Laterality Date   Coronary angioplasty with stent placement     Hernia repair     No Known Allergies Prior to Admission medications   Medication Sig Start Date End Date Taking? Authorizing Provider  amoxicillin (AMOXIL) 875 MG tablet Take 1 tablet (875 mg total) by mouth 2 (two) times daily. 04/26/13 05/06/13 Yes Sarah Harvie Bridge, PA-C  busPIRone (BUSPAR) 10 MG tablet Take 10 mg by mouth 3 (three) times daily.   Yes Historical Provider, MD  citalopram (CELEXA) 20 MG tablet Take 20 mg by mouth daily.   Yes  Historical Provider, MD  clopidogrel (PLAVIX) 75 MG tablet Take 1 tablet (75 mg total) by mouth daily. 02/08/13  Yes Runell Gess, MD  simvastatin (ZOCOR) 20 MG tablet Take 1 tablet (20 mg total) by mouth every evening. 02/08/13  Yes Runell Gess, MD   Results for orders placed in visit on 08/30/12  POCT URINALYSIS DIPSTICK      Result Value Range   Color, UA yellow     Clarity, UA clear     Glucose, UA neg     Bilirubin, UA neg     Ketones, UA neg     Spec Grav, UA 1.015     Blood, UA trace-intact     pH, UA 7.5     Protein, UA neg     Urobilinogen, UA 0.2     Nitrite, UA neg     Leukocytes, UA Negative    IFOBT (OCCULT BLOOD)      Result Value Range   IFOBT Negative      Review of Systems  Constitutional: Negative for fever.  HENT: Positive for ear discharge (right ear, clear discharge). Negative for congestion and rhinorrhea.        Positive for right ear stuffiness  Respiratory: Negative for cough.        Objective:   Physical Exam  Vitals  reviewed. Constitutional: He is oriented to person, place, and time. He appears well-developed and well-nourished.  HENT:  Head: Normocephalic and atraumatic.  Right Ear: Tympanic membrane, external ear and ear canal normal.  Left Ear: Tympanic membrane, external ear and ear canal normal.  Nose: No rhinorrhea.  Mouth/Throat: Oropharynx is clear and moist and mucous membranes are normal. No oropharyngeal exudate or posterior oropharyngeal erythema.  Right ear has TM obstructed due to clear yellow fluid in canal with canal erythema diffusely. No active bleeding. Has wet appear throughout canal. With minimal wall edema. No cervical lymphadenopathy. No preauricular lymphadenopathy. Mastoid is non tender.  Eyes: Conjunctivae are normal. Pupils are equal, round, and reactive to light.  Neck: Neck supple.  Cardiovascular: Normal rate, regular rhythm, normal heart sounds and intact distal pulses.   No murmur heard. Pulmonary/Chest:  Effort normal and breath sounds normal. He has no wheezes. He has no rhonchi. He has no rales.  Abdominal: Soft. There is no tenderness.  Lymphadenopathy:    He has no cervical adenopathy.  Neurological: He is alert and oriented to person, place, and time.  Skin: Skin is warm and dry. No rash noted.  Psychiatric: He has a normal mood and affect. His behavior is normal.    BP 122/66   Pulse 76   Temp(Src) 98.9 F (37.2 C) (Oral)   Resp 18   Ht 5' 10.5" (1.791 m)   Wt 224 lb 6.4 oz (101.787 kg)   BMI 31.73 kg/m2   SpO2 97%     Assessment & Plan:  [name] is a 62 y.o. male  AOM (acute otitis media), right - Plan: Ambulatory referral to ENT, ofloxacin (FLOXIN) 0.3 % otic solution, amoxicillin (AMOXIL) 875 MG tablet, DISCONTINUED: amoxicillin (AMOXIL) 875 MG tablet  Otitis media, suppurative, right - Plan: Ambulatory referral to ENT, ofloxacin (FLOXIN) 0.3 % otic solution, amoxicillin (AMOXIL) 875 MG tablet, DISCONTINUED: amoxicillin (AMOXIL) 875 MG tablet   Suspected suppurative otitis media - discussed avoiding qtips, extend amox for 4 more days, restart floxinb otic at BID dosing and referred to ENT. rtc precautions.  Meds ordered this encounter  Medications   DISCONTD: amoxicillin (AMOXIL) 875 MG tablet    Sig: Take 1 tablet (875 mg total) by mouth 2 (two) times daily.    Dispense:  20 tablet    Refill:  0    Order Specific Question:  Supervising Provider    Answer:  DOOLITTLE, ROBERT P [[id number]]   ofloxacin (FLOXIN) 0.3 % otic solution    Sig: Place 10 drops into the right ear 2 (two) times daily.    Dispense:  10 mL    Refill:  0   amoxicillin (AMOXIL) 875 MG tablet    Sig: Take 1 tablet (875 mg total) by mouth 2 (two) times daily.    Dispense:  8 tablet    Refill:  0   Patient Instructions  Do not use Q tips in ear. Increase drops in ear to twice per day, and continue oral antibiotic for 4 more days. We are referring you to ENT. Return to the clinic or go to the  nearest emergency room if any of your symptoms worsen or new symptoms occur.    I personally performed the services described in this documentation, which was scribed in my presence. The recorded information has been reviewed and considered, and addended by me as needed.

## 2013-05-06 NOTE — Patient Instructions (Signed)
Do not use Q tips in ear. Increase drops in ear to twice per day, and continue oral antibiotic for 4 more days. We are referring you to ENT. Return to the clinic or go to the nearest emergency room if any of your symptoms worsen or new symptoms occur.

## 2013-05-06 NOTE — Telephone Encounter (Signed)
Called Pharmacy and canceled the Rx for Amox #20. Eileen Stanford

## 2013-08-29 ENCOUNTER — Ambulatory Visit: Payer: Managed Care, Other (non HMO) | Admitting: Cardiovascular Disease

## 2013-09-03 ENCOUNTER — Ambulatory Visit: Payer: Managed Care, Other (non HMO) | Admitting: Cardiovascular Disease

## 2013-09-14 ENCOUNTER — Encounter: Payer: Self-pay | Admitting: Cardiovascular Disease

## 2013-09-14 ENCOUNTER — Ambulatory Visit (INDEPENDENT_AMBULATORY_CARE_PROVIDER_SITE_OTHER): Payer: Managed Care, Other (non HMO) | Admitting: Cardiovascular Disease

## 2013-09-14 VITALS — BP 118/88 | HR 65 | Ht 69.0 in | Wt 232.6 lb

## 2013-09-14 DIAGNOSIS — E785 Hyperlipidemia, unspecified: Secondary | ICD-10-CM | POA: Insufficient documentation

## 2013-09-14 DIAGNOSIS — Z79899 Other long term (current) drug therapy: Secondary | ICD-10-CM

## 2013-09-14 DIAGNOSIS — I2510 Atherosclerotic heart disease of native coronary artery without angina pectoris: Secondary | ICD-10-CM

## 2013-09-14 DIAGNOSIS — E782 Mixed hyperlipidemia: Secondary | ICD-10-CM

## 2013-09-14 NOTE — Assessment & Plan Note (Signed)
On statin therapy. We will recheck a lipid and liver profile 

## 2013-09-14 NOTE — Progress Notes (Signed)
09/14/2013 Maurice Duffy   1950-11-04  [id number]  Primary Physician Merrilee Seashore, MD Primary Cardiologist: Lorretta Harp MD [name]   HPI:  The patient is a very pleasant, 63 year old, mildly overweight, married Caucasian male, father of 1 daughter who was here 6 months ago. 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 He has a history of dyslipidemia and discontinued tobacco abuse. He has known CAD status post mid LAD PCI and stenting by myself using a Taxus drug-eluting stent in May 2002. I restudied him several weeks later because of chest pain and found this to be widely patent. He also has GERD. He is a very anxious gentleman with manic depressive disorder. His most recent lipid profile in July 2013  revealed total cholesterol 135, LDL of 60 and HDL of 44. Since I saw him a year ago he's remained asymptomatic    Current Outpatient Prescriptions  Medication Sig Dispense Refill  . busPIRone (BUSPAR) 15 MG tablet Take 15 mg by mouth 2 (two) times daily.      . citalopram (CELEXA) 20 MG tablet Take 20 mg by mouth daily.      . clopidogrel (PLAVIX) 75 MG tablet Take 1 tablet (75 mg total) by mouth daily.  90 tablet  2  . simvastatin (ZOCOR) 20 MG tablet Take 1 tablet (20 mg total) by mouth every evening.  90 tablet  2   No current facility-administered medications for this visit.    No Known Allergies  History   Social History  . Marital Status: Married    Spouse Name: N/A    Number of Children: N/A  . Years of Education: N/A   Occupational History  . Not on file.   Social History Main Topics  . Smoking status: Former Smoker -- 1.00 packs/day for 18 years    Quit date: 08/13/2011  . Smokeless tobacco: Not on file  . Alcohol Use: No  . Drug Use: No  . Sexual Activity: Yes   Other Topics Concern  . Not on file   Social History Narrative  . No narrative on file     Review of Systems: General: negative for chills, fever, night sweats  or weight changes.  Cardiovascular: negative for chest pain, dyspnea on exertion, edema, orthopnea, palpitations, paroxysmal nocturnal dyspnea or shortness of breath Dermatological: negative for rash Respiratory: negative for cough or wheezing Urologic: negative for hematuria Abdominal: negative for nausea, vomiting, diarrhea, bright red blood per rectum, melena, or hematemesis Neurologic: negative for visual changes, syncope, or dizziness All other systems reviewed and are otherwise negative except as noted above.    Blood pressure 118/88, pulse 65, height 5\' 9"  (1.753 m), weight 232 lb 9.6 oz (105.507 kg).  General appearance: alert and no distress Neck: no adenopathy, no carotid bruit, no JVD, supple, symmetrical, trachea midline and thyroid not enlarged, symmetric, no tenderness/mass/nodules Lungs: clear to auscultation bilaterally Heart: regular rate and rhythm, S1, S2 normal, no murmur, click, rub or gallop Extremities: extremities normal, atraumatic, no cyanosis or edema  EKG normal sinus rhythm at 65 without ST or T wave changes. There was left axis deviation  ASSESSMENT AND PLAN:   Hyperlipidemia On statin therapy. We will recheck a lipid and liver profile  CAD (coronary artery disease) History of CAD status post LAD PCI and stenting with a Taxus juggling stent May 2012. His last Myoview performed 12/30/11 was nonischemic. He denies chest pain or shortness of breath.      Roderic Palau  Adora Fridge MD FACP,FACC,FAHA, [facility] 09/14/2013 4:21 PM

## 2013-09-14 NOTE — Patient Instructions (Signed)
Dr Gwenlyn Found has ordered you to have some blood work to be done FASTING. (Lipid, Liver)  Dr Gwenlyn Found wants you to follow-up in 1 year. You will receive a reminder letter in the mail one months in advance. If you don't receive a letter, please call our office to schedule the follow-up appointment.

## 2013-09-14 NOTE — Assessment & Plan Note (Signed)
History of CAD status post LAD PCI and stenting with a Taxus juggling stent May 2012. His last Myoview performed 12/30/11 was nonischemic. He denies chest pain or shortness of breath.

## 2013-09-17 ENCOUNTER — Telehealth: Payer: Self-pay | Admitting: *Deleted

## 2013-09-17 MED ORDER — CLOPIDOGREL BISULFATE 75 MG PO TABS: 75.0000 mg | ORAL_TABLET | Freq: Every day | ORAL | Status: DC

## 2013-09-17 MED ORDER — SIMVASTATIN 20 MG PO TABS: 20.0000 mg | ORAL_TABLET | Freq: Every evening | ORAL | Status: DC

## 2013-09-17 NOTE — Telephone Encounter (Signed)
Pt's wife was calling in regards to Mr. Sons's Zocor and Plavix. She stated that he did not get a Rx when he left on Friday. She would like it to be called into [facility] on [address].  JB

## 2013-09-17 NOTE — Telephone Encounter (Signed)
Called patient to let him know both Rx refills were sent into [facility].

## 2014-03-26 IMAGING — CR DG SHOULDER 2+V*R*
2 series · 2 of 2 positions shown · non-contrast
Comparison: Preliminary reading of Dr. Rourke

CLINICAL DATA: Shoulder pain

RIGHT SHOULDER - 2+ VIEW

[ap ext rot]
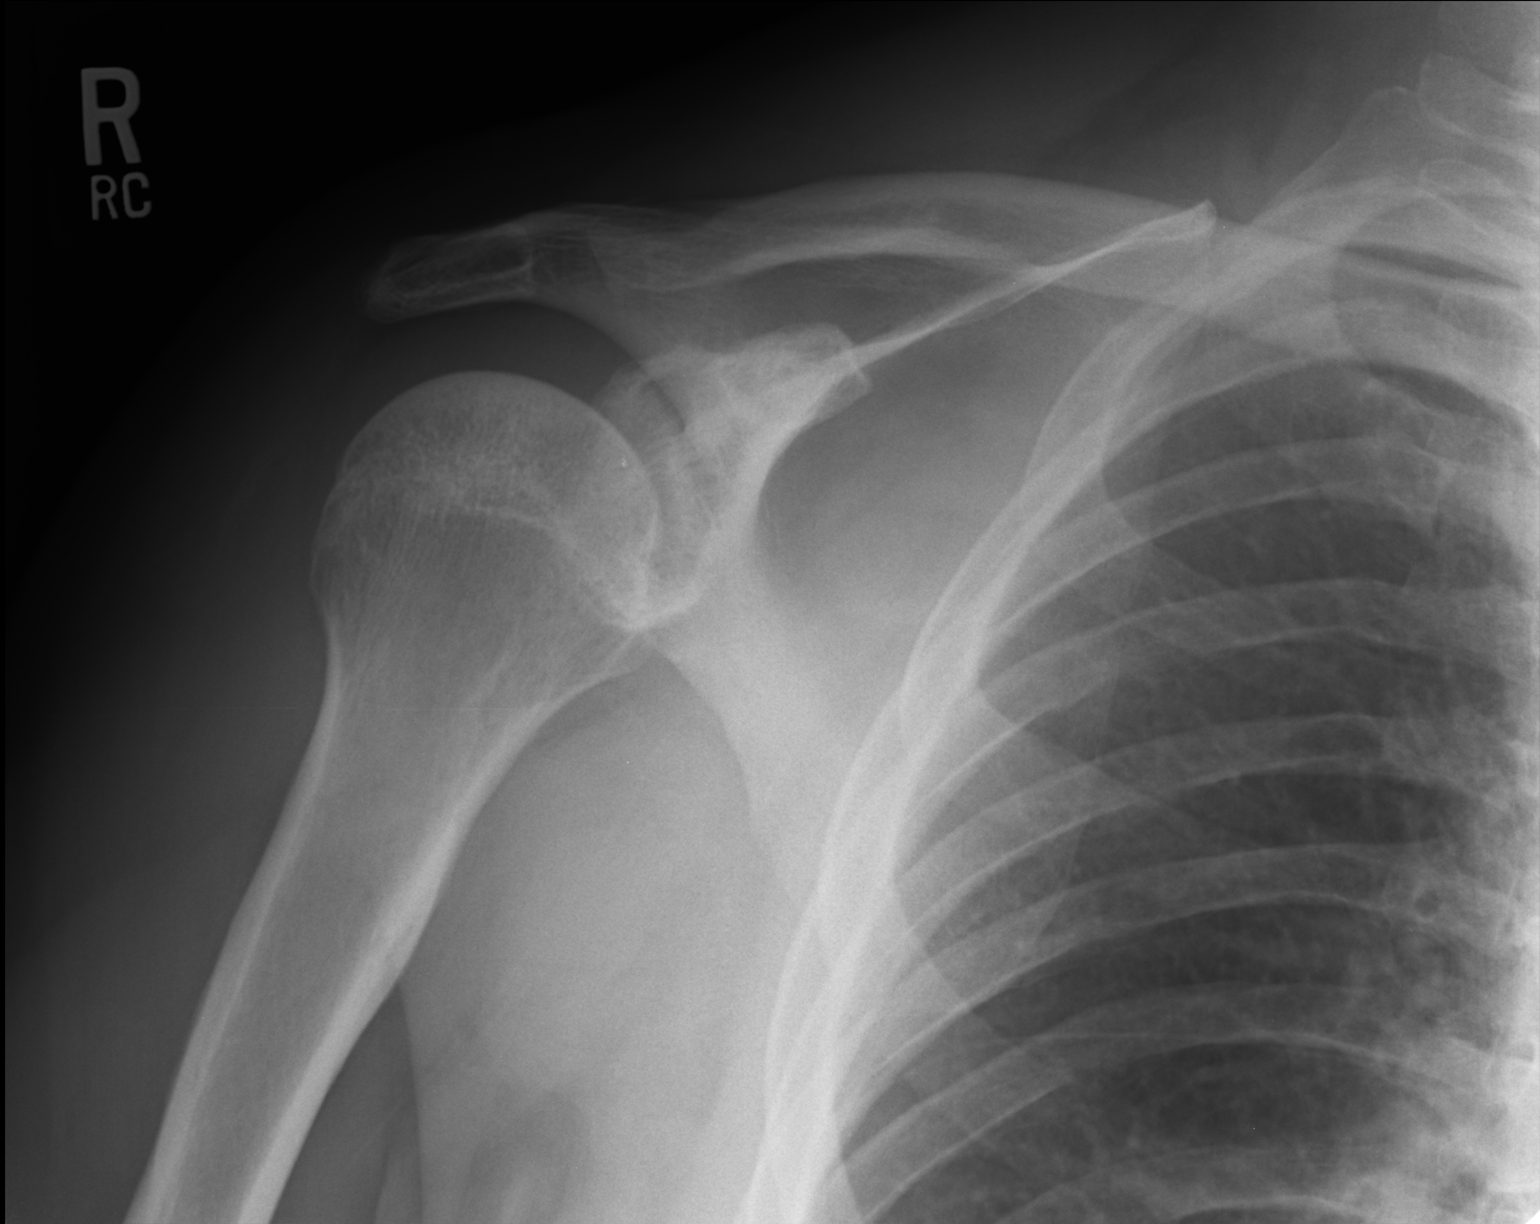

[ap int rot]
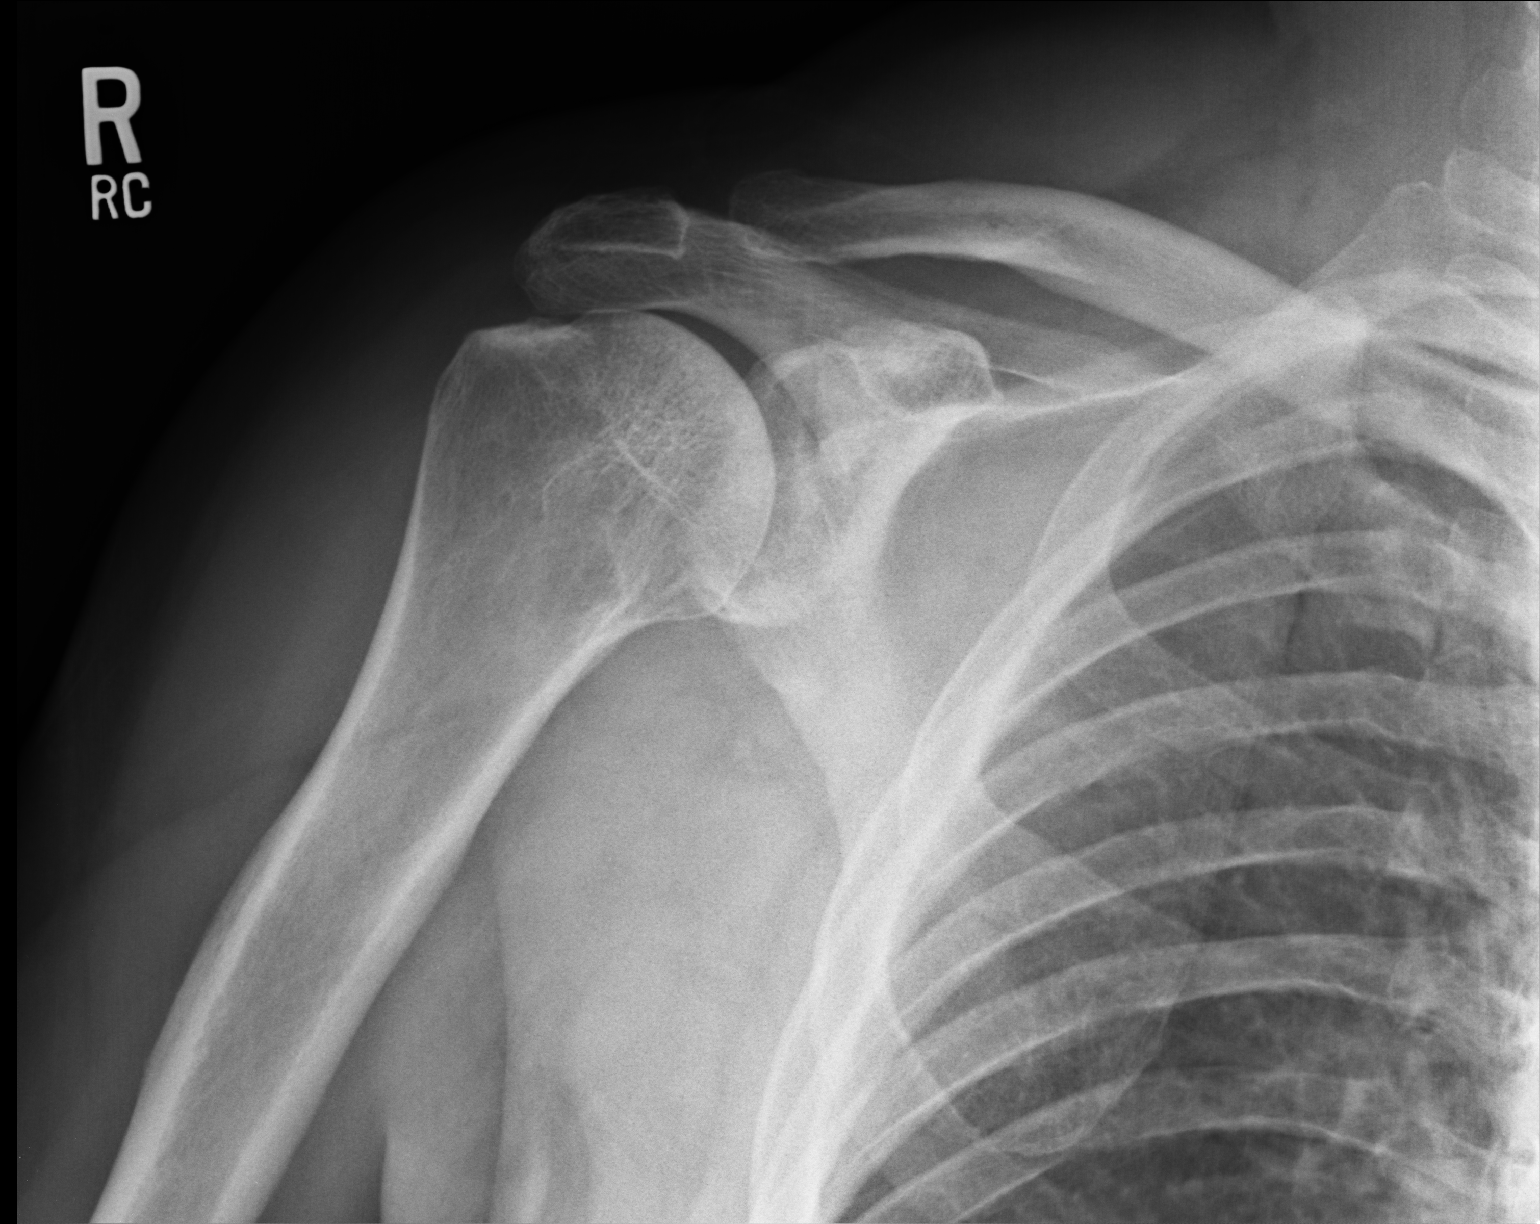

[2 of 2 positions shown; findings below may reference images not displayed]

FINDINGS: Two views of the right shoulder submitted.  No acute
fracture or subluxation.  The glenohumeral joint is preserved.
IMPRESSION: No acute fracture or subluxation.

Clinically significant discrepancy from primary report, if
provided: None

## 2014-06-20 ENCOUNTER — Encounter: Payer: No Typology Code available for payment source | Admitting: Family Medicine

## 2014-08-22 ENCOUNTER — Ambulatory Visit (INDEPENDENT_AMBULATORY_CARE_PROVIDER_SITE_OTHER): Payer: Managed Care, Other (non HMO) | Admitting: Family Medicine

## 2014-08-22 ENCOUNTER — Encounter: Payer: Self-pay | Admitting: Family Medicine

## 2014-08-22 VITALS — BP 110/72 | HR 55 | Temp 97.3°F | Resp 16 | Ht 69.5 in | Wt 179.2 lb

## 2014-08-22 DIAGNOSIS — S43002A Unspecified subluxation of left shoulder joint, initial encounter: Secondary | ICD-10-CM

## 2014-08-22 DIAGNOSIS — E291 Testicular hypofunction: Secondary | ICD-10-CM | POA: Diagnosis not present

## 2014-08-22 DIAGNOSIS — F419 Anxiety disorder, unspecified: Secondary | ICD-10-CM | POA: Diagnosis not present

## 2014-08-22 DIAGNOSIS — K219 Gastro-esophageal reflux disease without esophagitis: Secondary | ICD-10-CM | POA: Diagnosis not present

## 2014-08-22 DIAGNOSIS — Z114 Encounter for screening for human immunodeficiency virus [HIV]: Secondary | ICD-10-CM

## 2014-08-22 DIAGNOSIS — Z8719 Personal history of other diseases of the digestive system: Secondary | ICD-10-CM | POA: Insufficient documentation

## 2014-08-22 DIAGNOSIS — L989 Disorder of the skin and subcutaneous tissue, unspecified: Secondary | ICD-10-CM

## 2014-08-22 DIAGNOSIS — R5382 Chronic fatigue, unspecified: Secondary | ICD-10-CM

## 2014-08-22 DIAGNOSIS — Z0000 Encounter for general adult medical examination without abnormal findings: Secondary | ICD-10-CM | POA: Diagnosis not present

## 2014-08-22 DIAGNOSIS — E785 Hyperlipidemia, unspecified: Secondary | ICD-10-CM

## 2014-08-22 DIAGNOSIS — Z9889 Other specified postprocedural states: Secondary | ICD-10-CM | POA: Diagnosis not present

## 2014-08-22 DIAGNOSIS — Z1211 Encounter for screening for malignant neoplasm of colon: Secondary | ICD-10-CM

## 2014-08-22 DIAGNOSIS — Z7901 Long term (current) use of anticoagulants: Secondary | ICD-10-CM | POA: Diagnosis not present

## 2014-08-22 LAB — CBC WITH DIFFERENTIAL/PLATELET
Basophils Absolute: 0.1 10*3/uL (ref 0.0–0.1)
Basophils Relative: 1 % (ref 0–1)
Eosinophils Absolute: 0.3 10*3/uL (ref 0.0–0.7)
Eosinophils Relative: 5 % (ref 0–5)
HCT: 43.6 % (ref 39.0–52.0)
Hemoglobin: 14.8 g/dL (ref 13.0–17.0)
Lymphocytes Relative: 40 % (ref 12–46)
Lymphs Abs: 2.1 10*3/uL (ref 0.7–4.0)
MCH: 30.7 pg (ref 26.0–34.0)
MCHC: 33.9 g/dL (ref 30.0–36.0)
MCV: 90.5 fL (ref 78.0–100.0)
MPV: 9.5 fL (ref 8.6–12.4)
Monocytes Absolute: 0.6 10*3/uL (ref 0.1–1.0)
Monocytes Relative: 11 % (ref 3–12)
Neutro Abs: 2.2 10*3/uL (ref 1.7–7.7)
Neutrophils Relative %: 43 % (ref 43–77)
Platelets: 270 10*3/uL (ref 150–400)
RBC: 4.82 MIL/uL (ref 4.22–5.81)
RDW: 13.1 % (ref 11.5–15.5)
WBC: 5.2 10*3/uL (ref 4.0–10.5)

## 2014-08-22 LAB — COMPREHENSIVE METABOLIC PANEL
ALT: 40 U/L (ref 0–53)
AST: 34 U/L (ref 0–37)
Albumin: 4.1 g/dL (ref 3.5–5.2)
Alkaline Phosphatase: 48 U/L (ref 39–117)
BUN: 24 mg/dL — ABNORMAL HIGH (ref 6–23)
CO2: 24 mEq/L (ref 19–32)
Calcium: 9.1 mg/dL (ref 8.4–10.5)
Chloride: 107 mEq/L (ref 96–112)
Creat: 0.96 mg/dL (ref 0.50–1.35)
Glucose, Bld: 95 mg/dL (ref 70–99)
Potassium: 4.2 mEq/L (ref 3.5–5.3)
Sodium: 139 mEq/L (ref 135–145)
Total Bilirubin: 0.7 mg/dL (ref 0.2–1.2)
Total Protein: 6.3 g/dL (ref 6.0–8.3)

## 2014-08-22 LAB — LIPID PANEL
Cholesterol: 131 mg/dL (ref 0–200)
HDL: 50 mg/dL (ref >=40)
LDL Cholesterol: 53 mg/dL (ref 0–99)
Total CHOL/HDL Ratio: 2.6 Ratio
Triglycerides: 139 mg/dL (ref <150)
VLDL: 28 mg/dL (ref 0–40)

## 2014-08-22 LAB — HIV ANTIBODY (ROUTINE TESTING W REFLEX): HIV 1&2 Ab, 4th Generation: NONREACTIVE

## 2014-08-22 LAB — POC HEMOCCULT BLD/STL (OFFICE/1-CARD/DIAGNOSTIC): Fecal Occult Blood, POC: NEGATIVE

## 2014-08-22 LAB — TSH: TSH: 2.242 u[IU]/mL (ref 0.350–4.500)

## 2014-08-22 MED ORDER — SIMVASTATIN 20 MG PO TABS: 20.0000 mg | ORAL_TABLET | Freq: Every evening | ORAL | Status: DC

## 2014-08-22 MED ORDER — CITALOPRAM HYDROBROMIDE 20 MG PO TABS: 20.0000 mg | ORAL_TABLET | Freq: Every day | ORAL | Status: DC

## 2014-08-22 MED ORDER — CLOPIDOGREL BISULFATE 75 MG PO TABS: 75.0000 mg | ORAL_TABLET | Freq: Every day | ORAL | Status: DC

## 2014-08-22 MED ORDER — BUSPIRONE HCL 15 MG PO TABS: 15.0000 mg | ORAL_TABLET | Freq: Two times a day (BID) | ORAL | Status: DC

## 2014-08-22 NOTE — Progress Notes (Signed)
   Subjective:    Patient ID: HIEU HERMS, male    DOB: 1950/08/16, 64 y.o.   MRN: [id number]  HPI    Review of Systems  Constitutional: Negative.   HENT: Negative.   Eyes: Positive for itching.  Respiratory: Negative.   Cardiovascular: Negative.   Gastrointestinal: Negative.   Genitourinary: Negative.   Musculoskeletal: Negative.   Skin: Negative.   Allergic/Immunologic: Negative.   Neurological: Negative.   Hematological: Negative.   Psychiatric/Behavioral: Negative.        Objective:   Physical Exam        Assessment & Plan:  See below  Robyn Haber, MD

## 2014-08-22 NOTE — Patient Instructions (Signed)

## 2014-08-22 NOTE — Progress Notes (Signed)
Subjective:  This chart was scribed for Robyn Haber, MD by Randa Evens, ED Scribe. This Patient was seen in room 27 and the patients care was started at 10:17 AM   Patient ID: Maurice Duffy, male    DOB: [birth year], 65 y.o.   MRN: [id number]  Chief Complaint  Patient presents with   Annual Exam    HPI HPI Comments: Maurice Duffy is a 64 y.o. male PMHx CAD, anxiety, depression, and hyperlipidemia who presents to the [facility] for his annual exam. Pt states that he has been having left shoulder pain. Pt states that he has dislocated the shoulder 3 times. Pt states that he exercises regularly. Pt doesn't report any other symptoms. Pt states that his flu vaccination is UTD. Pt's last colonoscopy was over 5 years ago. Pt states that he is a no longer an everyday smoker(having quit after his PCI/stent). Pt states that he has a Hx of hernia on the left which was surgically corrected.   Patient has been very intensively exercising and has lost a total of 38 lbs. He has quit smoking and is now working 40 hours per week and exercising daily on the bicycle.  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 Pt cardiologist is Quay Burow due to having stent placed in LAD  Past Medical History  Diagnosis Date   Coronary artery disease    Anxiety    Depression    Hyperlipidemia      Prior to Admission medications   Medication Sig Start Date End Date Taking? Authorizing Provider  busPIRone (BUSPAR) 15 MG tablet Take 1 tablet (15 mg total) by mouth 2 (two) times daily. 08/22/14  Yes Robyn Haber, MD  citalopram (CELEXA) 20 MG tablet Take 1 tablet (20 mg total) by mouth daily. 08/22/14  Yes Robyn Haber, MD  clopidogrel (PLAVIX) 75 MG tablet Take 1 tablet (75 mg total) by mouth daily. 08/22/14  Yes Robyn Haber, MD  simvastatin (ZOCOR) 20 MG tablet Take 1 tablet (20 mg total) by mouth every  evening. 08/22/14  Yes Robyn Haber, MD    Review of Systems  Musculoskeletal: Positive for arthralgias (left shoulder).  All other systems reviewed and are negative.    Objective:   BP 110/72 mmHg   Pulse 55   Temp(Src) 97.3 F (36.3 C) (Oral)   Resp 16   Ht 5' 9.5" (1.765 m)   Wt 179 lb 3.2 oz (81.285 kg)   BMI 26.09 kg/m2   SpO2 97%  This man is now in great shape, with the exception of the left shoulder subluxation from past dislocations.   Physical Exam  Constitutional: He is oriented to person, place, and time. He appears well-developed and well-nourished. No distress.  HENT:  Head: Normocephalic and atraumatic.  Left cheek 1-2 mm papule with telangiectasia   Eyes: Conjunctivae and EOM are normal.  Neck: Neck supple. No tracheal deviation present.  Cardiovascular: Normal rate.   Pulmonary/Chest: Effort normal. No respiratory distress.  Abdominal: Soft. He exhibits no distension and no mass. There is no tenderness. There is no rebound and no guarding.  Genitourinary: Rectum normal and penis normal. No penile tenderness.  Markedly enlarged prostate. Chaperone present.  Absent left testicle  Musculoskeletal: Normal range of motion.  Subluxed left shoulder.   Neurological: He is alert and oriented to person, place, and time.  Skin: Skin  is warm and dry.  Psychiatric: He has a normal mood and affect. His behavior is normal.  Nursing note and vitals reviewed.    Assessment & Plan:   This chart was scribed in my presence and reviewed by me personally.    ICD-9-CM ICD-10-CM   1. Annual physical exam V70.0 Z00.00 CBC with Differential/Platelet     PSA     TSH     POCT urinalysis dipstick  2. Anxiety 300.00 F41.9 busPIRone (BUSPAR) 15 MG tablet     citalopram (CELEXA) 20 MG tablet  3. Hyperlipidemia 272.4 E78.5 simvastatin (ZOCOR) 20 MG tablet     Comprehensive metabolic panel     Lipid panel     TSH  4. Long term current use of anticoagulant therapy V58.61 Z79.01  clopidogrel (PLAVIX) 75 MG tablet  5. HyperlipidemiaActive 272.4 E78.5 simvastatin (ZOCOR) 20 MG tablet     Comprehensive metabolic panel     Lipid panel     TSH  6. Gastroesophageal reflux disease without esophagitis 530.81 K21.9   7. Screening for HIV (human immunodeficiency virus) V73.89 Z11.4 HIV antibody  8. Shoulder subluxation, left, initial encounter 831.00 S43.002A Ambulatory referral to Physical Therapy  9. Lesion of skin of face 709.9 L98.9 Ambulatory referral to Dermatology  10. Chronic fatigue 780.79 R53.82 Testosterone, Free, Total, SHBG     Signed, Robyn Haber, MD

## 2014-08-23 LAB — TESTOSTERONE, FREE, TOTAL, SHBG
Sex Hormone Binding: 54 nmol/L (ref 22–77)
Testosterone, Free: 69.9 pg/mL (ref 47.0–244.0)
Testosterone-% Free: 1.5 % — ABNORMAL LOW (ref 1.6–2.9)
Testosterone: 468 ng/dL (ref 300–890)

## 2014-08-23 LAB — PSA: PSA: 0.99 ng/mL (ref <=4.00)

## 2014-08-23 NOTE — Addendum Note (Signed)
Addended by: Constance Goltz on: 08/23/2014 03:02 PM   Modules accepted: Orders

## 2014-09-26 ENCOUNTER — Telehealth: Payer: Self-pay | Admitting: Cardiovascular Disease

## 2014-09-26 DIAGNOSIS — Z7901 Long term (current) use of anticoagulants: Secondary | ICD-10-CM

## 2014-09-26 DIAGNOSIS — E785 Hyperlipidemia, unspecified: Secondary | ICD-10-CM

## 2014-09-26 MED ORDER — CLOPIDOGREL BISULFATE 75 MG PO TABS
75.0000 mg | ORAL_TABLET | Freq: Every day | ORAL | Status: DC
Start: 2014-09-26 — End: 2016-02-26

## 2014-09-26 MED ORDER — SIMVASTATIN 20 MG PO TABS: 20.0000 mg | ORAL_TABLET | Freq: Every evening | ORAL | Status: DC

## 2014-09-26 NOTE — Telephone Encounter (Signed)
°  1. Which medications need to be refilled? Plavix and Zocort  2. Which pharmacy is medication to be sent [facility]?[address][phone]  3. Do they need a 30 day or 90 day supply? 90   4. Would they like a call back once the medication has been sent to the pharmacy? no

## 2014-09-26 NOTE — Telephone Encounter (Signed)
E-sent medication to [facility] REFILL , APPOINTMENT SCHEDULE 11/2014

## 2014-11-07 ENCOUNTER — Encounter: Payer: Self-pay | Admitting: *Deleted

## 2014-11-20 ENCOUNTER — Ambulatory Visit: Payer: Managed Care, Other (non HMO) | Admitting: Cardiovascular Disease

## 2014-12-26 ENCOUNTER — Encounter: Payer: Self-pay | Admitting: Cardiovascular Disease

## 2015-02-12 ENCOUNTER — Ambulatory Visit (INDEPENDENT_AMBULATORY_CARE_PROVIDER_SITE_OTHER): Payer: Managed Care, Other (non HMO) | Admitting: Cardiovascular Disease

## 2015-02-12 ENCOUNTER — Encounter: Payer: Self-pay | Admitting: Cardiovascular Disease

## 2015-02-12 VITALS — BP 136/82 | HR 74 | Ht 69.0 in | Wt 182.0 lb

## 2015-02-12 DIAGNOSIS — E785 Hyperlipidemia, unspecified: Secondary | ICD-10-CM | POA: Diagnosis not present

## 2015-02-12 DIAGNOSIS — I2583 Coronary atherosclerosis due to lipid rich plaque: Secondary | ICD-10-CM

## 2015-02-12 DIAGNOSIS — I2510 Atherosclerotic heart disease of native coronary artery without angina pectoris: Secondary | ICD-10-CM | POA: Diagnosis not present

## 2015-02-12 NOTE — Assessment & Plan Note (Signed)
History of hyperlipidemia on simvastatin 20 mg a day with recent lipid profile performed 08/22/14 revealed total cholesterol 131, LDL 53 and HDL of 50

## 2015-02-12 NOTE — Patient Instructions (Signed)
Your physician wants you to follow-up in: 1 year with Dr Berry. You will receive a reminder letter in the mail two months in advance. If you don't receive a letter, please call our office to schedule the follow-up appointment.  

## 2015-02-12 NOTE — Assessment & Plan Note (Signed)
History of CAD status post mid LAD PCI and stenting by myself using a Taxus drug eluding stent May 2002. He denies chest pain or shortness of breath. He had a negative stress test 12/30/11.

## 2015-02-12 NOTE — Progress Notes (Signed)
02/12/2015 Maurice Duffy   10/01/1950  [id number]  Primary Physician Maurice Seashore, MD Primary Cardiologist: Maurice Harp MD Maurice Duffy   HPI:  The patient is a very pleasant, 64 year-old, mildly overweight, married Caucasian male, father of 1 daughter who I last saw 09/14/13.. 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 He has a history of dyslipidemia and discontinued tobacco abuse. He has known CAD status post mid LAD PCI and stenting by myself using a Taxus drug-eluting stent in May 2002. I restudied him several weeks later because of chest pain and found this to be widely patent. He also has GERD. He is a very anxious gentleman with manic depressive disorder. He had a negative Myoview in 2013. He denies chest pain or shortness of breath. His most recent lipid profile performed 08/22/14 revealed a total cholesterol 131, LDL of 53 and HDL of 50  Current Outpatient Prescriptions  Medication Sig Dispense Refill  . busPIRone (BUSPAR) 15 MG tablet Take 1 tablet (15 mg total) by mouth 2 (two) times daily. 60 tablet 11  . clopidogrel (PLAVIX) 75 MG tablet Take 1 tablet (75 mg total) by mouth daily. 90 tablet 0  . simvastatin (ZOCOR) 20 MG tablet Take 1 tablet (20 mg total) by mouth every evening. 90 tablet 0   No current facility-administered medications for this visit.    No Known Allergies  Social History   Social History  . Marital Status: Married    Spouse Name: N/A  . Number of Children: N/A  . Years of Education: N/A   Occupational History  . Registered Nurse    Social History Main Topics  . Smoking status: Former Smoker -- 1.00 packs/day for 18 years    Quit date: 08/13/2011  . Smokeless tobacco: Not on file  . Alcohol Use: No  . Drug Use: No  . Sexual Activity: Yes   Other Topics Concern  . Not on file   Social History Narrative   Married. Education: [employer].     Review of Systems: General: negative for chills, fever, night sweats  or weight changes.  Cardiovascular: negative for chest pain, dyspnea on exertion, edema, orthopnea, palpitations, paroxysmal nocturnal dyspnea or shortness of breath Dermatological: negative for rash Respiratory: negative for cough or wheezing Urologic: negative for hematuria Abdominal: negative for nausea, vomiting, diarrhea, bright red blood per rectum, melena, or hematemesis Neurologic: negative for visual changes, syncope, or dizziness All other systems reviewed and are otherwise negative except as noted above.    Blood pressure 136/82, pulse 74, height 5\' 9"  (1.753 m), weight 182 lb (82.555 kg).  General appearance: alert and no distress Neck: no adenopathy, no carotid bruit, no JVD, supple, symmetrical, trachea midline and thyroid not enlarged, symmetric, no tenderness/mass/nodules Lungs: clear to auscultation bilaterally Heart: regular rate and rhythm, S1, S2 normal, no murmur, click, rub or gallop Extremities: extremities normal, atraumatic, no cyanosis or edema  EKG normal/74  ST or T-wave changes. I personally reviewed this EKG  ASSESSMENT AND PLAN:   Hyperlipidemia History of hyperlipidemia on simvastatin 20 mg a day with recent lipid profile performed 08/22/14 revealed total cholesterol 131, LDL 53 and HDL of 50  CAD (coronary artery disease) History of CAD status post mid LAD PCI and stenting by myself using a Taxus drug eluding stent May 2002. He denies chest pain or shortness of breath. He had a negative stress test 12/30/11.      Maurice Harp MD FACP,FACC,FAHA, [facility] 02/12/2015 3:11  PM

## 2016-02-26 ENCOUNTER — Ambulatory Visit (INDEPENDENT_AMBULATORY_CARE_PROVIDER_SITE_OTHER): Payer: BLUE CROSS/BLUE SHIELD | Admitting: Family Medicine

## 2016-02-26 ENCOUNTER — Encounter: Payer: Self-pay | Admitting: Family Medicine

## 2016-02-26 VITALS — BP 118/80 | HR 67 | Temp 97.7°F | Resp 18 | Ht 69.0 in | Wt 199.8 lb

## 2016-02-26 DIAGNOSIS — Z23 Encounter for immunization: Secondary | ICD-10-CM | POA: Diagnosis not present

## 2016-02-26 DIAGNOSIS — Z7901 Long term (current) use of anticoagulants: Secondary | ICD-10-CM | POA: Diagnosis not present

## 2016-02-26 DIAGNOSIS — Z9119 Patient's noncompliance with other medical treatment and regimen: Secondary | ICD-10-CM

## 2016-02-26 DIAGNOSIS — H1013 Acute atopic conjunctivitis, bilateral: Secondary | ICD-10-CM

## 2016-02-26 DIAGNOSIS — Z1159 Encounter for screening for other viral diseases: Secondary | ICD-10-CM | POA: Diagnosis not present

## 2016-02-26 DIAGNOSIS — K429 Umbilical hernia without obstruction or gangrene: Secondary | ICD-10-CM

## 2016-02-26 DIAGNOSIS — E785 Hyperlipidemia, unspecified: Secondary | ICD-10-CM | POA: Diagnosis not present

## 2016-02-26 DIAGNOSIS — Z0000 Encounter for general adult medical examination without abnormal findings: Secondary | ICD-10-CM

## 2016-02-26 DIAGNOSIS — Z125 Encounter for screening for malignant neoplasm of prostate: Secondary | ICD-10-CM | POA: Diagnosis not present

## 2016-02-26 DIAGNOSIS — Z91199 Patient's noncompliance with other medical treatment and regimen due to unspecified reason: Secondary | ICD-10-CM

## 2016-02-26 DIAGNOSIS — I2510 Atherosclerotic heart disease of native coronary artery without angina pectoris: Secondary | ICD-10-CM | POA: Diagnosis not present

## 2016-02-26 DIAGNOSIS — F419 Anxiety disorder, unspecified: Secondary | ICD-10-CM

## 2016-02-26 DIAGNOSIS — F418 Other specified anxiety disorders: Secondary | ICD-10-CM

## 2016-02-26 DIAGNOSIS — Z1329 Encounter for screening for other suspected endocrine disorder: Secondary | ICD-10-CM

## 2016-02-26 LAB — COMPLETE METABOLIC PANEL WITH GFR
ALBUMIN: 4.3 g/dL (ref 3.6–5.1)
ALK PHOS: 69 U/L (ref 40–115)
ALT: 47 U/L — AB (ref 9–46)
AST: 63 U/L — AB (ref 10–35)
BILIRUBIN TOTAL: 0.5 mg/dL (ref 0.2–1.2)
BUN: 15 mg/dL (ref 7–25)
CALCIUM: 9.2 mg/dL (ref 8.6–10.3)
CO2: 23 mmol/L (ref 20–31)
CREATININE: 0.81 mg/dL (ref 0.70–1.25)
Chloride: 108 mmol/L (ref 98–110)
GFR, Est African American: >89 mL/min (ref >=60)
GFR, Est Non African American: >89 mL/min (ref >=60)
Glucose, Bld: 94 mg/dL (ref 65–99)
Potassium: 4.5 mmol/L (ref 3.5–5.3)
Sodium: 139 mmol/L (ref 135–146)
TOTAL PROTEIN: 6.7 g/dL (ref 6.1–8.1)

## 2016-02-26 LAB — LIPID PANEL
CHOLESTEROL: 186 mg/dL (ref 125–200)
HDL: 42 mg/dL (ref >=40)
LDL Cholesterol: 94 mg/dL (ref <130)
Total CHOL/HDL Ratio: 4.4 Ratio (ref <=5.0)
Triglycerides: 250 mg/dL — ABNORMAL HIGH (ref <150)
VLDL: 50 mg/dL — ABNORMAL HIGH (ref <30)

## 2016-02-26 LAB — PSA: PSA: 0.9 ng/mL (ref <=4.0)

## 2016-02-26 MED ORDER — AZELASTINE HCL 0.05 % OP SOLN: 1.0000 [drp] | Freq: Two times a day (BID) | OPHTHALMIC | 12 refills | Status: AC

## 2016-02-26 MED ORDER — ZOSTER VACCINE LIVE 19400 UNT/0.65ML ~~LOC~~ SUSR: 0.6500 mL | Freq: Once | SUBCUTANEOUS | 0 refills | Status: AC

## 2016-02-26 MED ORDER — SIMVASTATIN 20 MG PO TABS: 20.0000 mg | ORAL_TABLET | Freq: Every evening | ORAL | 0 refills | Status: DC

## 2016-02-26 MED ORDER — CITALOPRAM HYDROBROMIDE 20 MG PO TABS: 20.0000 mg | ORAL_TABLET | Freq: Every day | ORAL | 1 refills | Status: DC

## 2016-02-26 MED ORDER — BUSPIRONE HCL 15 MG PO TABS: 15.0000 mg | ORAL_TABLET | Freq: Two times a day (BID) | ORAL | 1 refills | Status: DC

## 2016-02-26 MED ORDER — CLOPIDOGREL BISULFATE 75 MG PO TABS: 75.0000 mg | ORAL_TABLET | Freq: Every day | ORAL | 0 refills | Status: DC

## 2016-02-26 NOTE — Progress Notes (Signed)
Subjective:  By signing my name below, I, Maurice Duffy, attest that this documentation has been prepared under the direction and in the presence of [name], MD. Electronically Signed: Moises Duffy, [place]. 02/26/2016 , 11:37 AM .  Patient was seen in Room 3 .   Patient ID: Maurice Duffy, male    DOB: December 29, 1950, 65 y.o.   MRN: [id number] Chief Complaint  Patient presents with  . Annual Exam    CPE   HPI Maurice Duffy is a 65 y.o. male Here for annual physical. Previous patient of [name]. Last physical was in March 2016. He has a history of coronary artery disease, anxiety with depression (listed as bipolar 2 disorder in problem list), GERD and HLD. He was a Equities trader, retired in Dec 2016.   Possible abdominal hernia He's noticed possible abdominal hernia over RUQ 4 months ago that "pops out" when he becomes gassy. He states this occurred out of the blue. He denies any straining or injury to the area. He denies any abdominal pain.   Coronary artery disease He's followed by his cardiologist, [name]. PCI and stent to the LAD in May 2002. He takes plavix 75mg  qd. His last stress test shown in [facility] was in July 2013, 10 METS on Bruce protocol. EF of 54% on echo without wall motion abnormalities, low risk scan; done by Dr. [name] in July 2013. His last office visit with [name] was in Aug 2016, and was asymptomatic at that time.   [name] requests having EKG done today. He's stopped taking his plavix and simvastatin since January. He believes he had a side effect with the simvastatin. He's out of medication for plavix.   Testosterone He had normal testosterone of 468 in March last year, but borderline low % free. He was referred to endocrinologist but did not see them.   He's taking OTC testosterone booster supplement.   Cancer Screening Colonoscopy: He states last one done was a while ago, believes in 2009; normal and repeat in 10 years.  Prostate cancer  screening:  Lab Results  Component Value Date   PSA 0.99 08/22/2014    Immunizations Immunization History  Administered Date(s) Administered  . Influenza-Unspecified 03/14/2014  . Tdap 12/27/2010   He agrees to sihngles vaccine.  He declines flu shot today as he had bad symptoms from it last received, and still caught the flu that year.   Depression Depression screen [facility] 2/9 02/26/2016  Decreased Interest 0  Down, Depressed, Hopeless 0  PHQ - 2 Score 0   He takes buspar 15mg  bid for anxiety symptoms. He's taken celexa in the past as well. He stopped taking buspar in January 2017. He noticed his anxiety improved after retiring. He informs feeling anxious occasionally but believes due to financial issues. His mind is constantly racing and becoming forgetful, which his wife notices all the time. He denies noticing any lows. He denies side effects with celexa. He denies being treated for ADD in the past.   He was seen by [name] 2 years ago.   Vision  Visual Acuity Screening   Right eye Left eye Both eyes  Without correction: 20/40 20/20 20/20   With correction:      He's been having watery eyes and light sensitivity for a few months now since May. He hasn't seen his eye doctor recently.   Dentist He has seen his dentist in the past year.   Exercise He exercises about 3~4 times a  week. He denies chest pain, tightness or shortness of breath.   Advanced Directives <no information>   Hep C screening He believes he's had this done but cannot recall. He agrees to have this checked today.   HIV screening This was tested non reactive last year.   Patient Active Problem List   Diagnosis Date Noted  . GERD (gastroesophageal reflux disease) 08/22/2014  . H/O left inguinal hernia repair 08/22/2014  . Hyperlipidemia 09/14/2013  . Bipolar 2 disorder ([place]) 12/22/2011  . Obesity 12/22/2011  . CAD (coronary artery disease) 12/22/2011   Past Medical History:  Diagnosis Date    . Anxiety   . Coronary artery disease   . Depression   . Hyperlipidemia    Past Surgical History:  Procedure Laterality Date  . CORONARY ANGIOPLASTY WITH STENT PLACEMENT    . HERNIA REPAIR    . NM MYOVIEW LTD  2013   No Known Allergies Prior to Admission medications   Medication Sig Start Date End Date Taking? Authorizing Provider  busPIRone (BUSPAR) 15 MG tablet Take 1 tablet (15 mg total) by mouth 2 (two) times daily. Patient not taking: Reported on 02/26/2016 08/22/14   Robyn Haber, MD  clopidogrel (PLAVIX) 75 MG tablet Take 1 tablet (75 mg total) by mouth daily. Patient not taking: Reported on 02/26/2016 09/26/14   Lorretta Harp, MD  simvastatin (ZOCOR) 20 MG tablet Take 1 tablet (20 mg total) by mouth every evening. Patient not taking: Reported on 02/26/2016 09/26/14   Lorretta Harp, MD   Social History   Social History  . Marital status: Married    Spouse name: N/A  . Number of children: N/A  . Years of education: N/A   Occupational History  . Registered Nurse    Social History Main Topics  . Smoking status: Former Smoker    Packs/day: 1.00    Years: 18.00    Quit date: 08/13/2011  . Smokeless tobacco: Not on file  . Alcohol use No  . Drug use: No  . Sexual activity: Yes   Other Topics Concern  . Not on file   Social History Narrative   Married. Education: [employer].   Review of Systems 13 point ROS - positive for watery eyes, eye itching, photosensitivity, and decreased concentration     Objective:   Physical Exam  Constitutional: He is oriented to person, place, and time. He appears well-developed and well-nourished.  HENT:  Head: Normocephalic and atraumatic.  Right Ear: External ear normal.  Left Ear: External ear normal.  Mouth/Throat: Oropharynx is clear and moist.  Eyes: Conjunctivae and EOM are normal. Pupils are equal, round, and reactive to light.  Neck: Normal range of motion. Neck supple. No thyromegaly present.  Cardiovascular: Normal  rate, regular rhythm, normal heart sounds and intact distal pulses.   Pulmonary/Chest: Effort normal and breath sounds normal. No respiratory distress. He has no wheezes.  Abdominal: Soft. He exhibits no distension. There is no tenderness. Hernia confirmed negative in the right inguinal area and confirmed negative in the left inguinal area.  Easily reducible umbilical hernia, non tender, no surrounding erythema  Musculoskeletal: Normal range of motion. He exhibits no edema or tenderness.  Limitations in ROM of left shoulde  Lymphadenopathy:    He has no cervical adenopathy.  Neurological: He is alert and oriented to person, place, and time. He has normal reflexes.  Skin: Skin is warm and dry.  Psychiatric: His behavior is normal. His mood appears anxious.  Appears very anxious  throughout visit  Vitals reviewed.   Vitals:   02/26/16 1033  BP: 118/80  Pulse: 67  Resp: 18  Temp: 97.7 F (36.5 C)  TempSrc: Oral  SpO2: 97%  Weight: 199 lb 12.8 oz (90.6 kg)  Height: 5\' 9"  (1.753 m)      Assessment & Plan:    CAYLAN NUNZIATA is a 65 y.o. male Annual physical exam - Plan: Testosterone  --anticipatory guidance as below in AVS, screening labs above. Health maintenance items as above in HPI discussed/recommended as applicable.   Need for shingles vaccine - Plan: Zoster Vaccine Live, PF, (ZOSTAVAX) 16109 UNT/0.65ML injection  - rx printed for him to fill at pharmacy if he chooses.   Screening for prostate cancer - Plan: PSA  - We discussed pros and cons of prostate cancer screening, and after this discussion, he chose to have screening done. PSA obtained  Coronary artery disease involving native coronary artery of native heart without angina pectoris - Plan: clopidogrel (PLAVIX) 75 MG tablet  - plavix refilled, but discussed need to follow back up with his cardiologist.   Long term current use of anticoagulant therapy - Plan: clopidogrel (PLAVIX) 75 MG tablet  -as above.    Hyperlipidemia - Plan: simvastatin (ZOCOR) 20 MG tablet, COMPLETE METABOLIC PANEL WITH GFR, Lipid panel  - restart simvastatin, labs pending, follow up with cardiology.   History of nonadherence to medical treatment  - with hx of CAD, discussed importance of meds and possible risks with nonadherence. Understanding expressed.   Depression with anxiety - Plan: citalopram (CELEXA) 20 MG tablet, busPIRone (BUSPAR) 15 MG tablet Anxiety - Plan: citalopram (CELEXA) 20 MG tablet, busPIRone (BUSPAR) 15 MG tablet  - restart both celexa and buspar as sx's not controlled. Possible Bipolar by hx, so watch for manic sx's.   Allergic conjunctivitis, bilateral - Plan: azelastine (OPTIVAR) 0.05 % ophthalmic solution  - try optivar, follow up with optho.   Need for hepatitis C screening test - Plan: Hepatitis C antibody  Umbilical hernia without obstruction and without gangrene - Plan: Ambulatory referral to General Surgery  - hernia precautions, eval with gen surgery.   Meds ordered this encounter  Medications  . Zoster Vaccine Live, PF, (ZOSTAVAX) 60454 UNT/0.65ML injection    Sig: Inject 19,400 Units into the skin once.    Dispense:  1 each    Refill:  0  . clopidogrel (PLAVIX) 75 MG tablet    Sig: Take 1 tablet (75 mg total) by mouth daily.    Dispense:  90 tablet    Refill:  0  . simvastatin (ZOCOR) 20 MG tablet    Sig: Take 1 tablet (20 mg total) by mouth every evening.    Dispense:  90 tablet    Refill:  0  . citalopram (CELEXA) 20 MG tablet    Sig: Take 1 tablet (20 mg total) by mouth daily.    Dispense:  30 tablet    Refill:  1  . busPIRone (BUSPAR) 15 MG tablet    Sig: Take 1 tablet (15 mg total) by mouth 2 (two) times daily.    Dispense:  60 tablet    Refill:  1  . azelastine (OPTIVAR) 0.05 % ophthalmic solution    Sig: Place 1 drop into both eyes 2 (two) times daily.    Dispense:  6 mL    Refill:  12   Patient Instructions   Start Optivar drops for possible allergic  conjunctivitis. Call your eye care provider for  follow-up in the next few weeks. Return if worsening symptoms sooner.  Your anxiety appears to not be treated at this time. Restart Celexa once per day, and BuSpar twice per day. If any increasing anxiety symptoms, or any manic symptoms as we discussed, return right away as you may need to have other medications or evaluation with psychiatry again. Follow-up in 3-4 weeks to discuss the symptoms further and recheck on medication.  I will refer you to the general surgeon to evaluate the area around the umbilicus as this is a possible hernia. See precautions below.  Restart cholesterol medication, and other medications as advised by your cardiologist to lessen chance of recurrent heart disease or stroke. Call cardiology for follow-up as you are overdue.  Shingles vaccine was printed if you would like to have that done after checking into the cost and insurance coverage to your pharmacy. If you change your mind about flu shot, return here or any practice in town providing that vaccine.  Do not take any further testosterone supplements over-the-counter. I will check a testosterone level today, but this will need to be repeated at least once between 8 and 10 in the morning if the level today is low. Your previous level overall looked okay, and only one percentage of it was borderline low.  Keeping you healthy  Get these tests  Duffy pressure- Have your Duffy pressure checked once a year by your healthcare provider.  Normal Duffy pressure is 120/80  Weight- Have your body mass index (BMI) calculated to screen for obesity.  BMI is a measure of body fat based on height and weight. You can also calculate your own BMI at ViewBanking.si.  Cholesterol- Have your cholesterol checked every year.  Diabetes- Have your Duffy sugar checked regularly if you have high Duffy pressure, high cholesterol, have a family history of diabetes or if you are  overweight.  Screening for Colon Cancer- Colonoscopy starting at age 70.  Screening may begin sooner depending on your family history and other health conditions. Follow up colonoscopy as directed by your Gastroenterologist.  Screening for Prostate Cancer- Both Duffy work (PSA) and a rectal exam help screen for Prostate Cancer.  Screening begins at age 36 with African-American men and at age 2 with Caucasian men.  Screening may begin sooner depending on your family history.  Take these medicines  Aspirin- One aspirin daily can help prevent Heart disease and Stroke.  Flu shot- Every fall.  Tetanus- Every 10 years.  Zostavax- Once after the age of 5 to prevent Shingles.  Pneumonia shot- Once after the age of 13; if you are younger than 43, ask your healthcare provider if you need a Pneumonia shot.  Take these steps  Don't smoke- If you do smoke, talk to your doctor about quitting.  For tips on how to quit, go to www.smokefree.gov or call 1-800-QUIT-NOW.  Be physically active- Exercise 5 days a week for at least 30 minutes.  If you are not already physically active start slow and gradually work up to 30 minutes of moderate physical activity.  Examples of moderate activity include walking briskly, mowing the yard, dancing, swimming, bicycling, etc.  Eat a healthy diet- Eat a variety of healthy food such as fruits, vegetables, low fat milk, low fat cheese, yogurt, lean meant, poultry, fish, beans, tofu, etc. For more information go to www.thenutritionsource.org  Drink alcohol in moderation- Limit alcohol intake to less than two drinks a day. Never drink and drive.  Dentist- Brush and floss  twice daily; visit your dentist twice a year.  Depression- Your emotional health is as important as your physical health. If you're feeling down, or losing interest in things you would normally enjoy please talk to your healthcare provider.  Eye exam- Visit your eye doctor every year.  Safe sex- If  you may be exposed to a sexually transmitted infection, use a condom.  Seat belts- Seat belts can save your life; always wear one.  Smoke/Carbon Monoxide detectors- These detectors need to be installed on the appropriate level of your home.  Replace batteries at least once a year.  Skin cancer- When out in the sun, cover up and use sunscreen 15 SPF or higher.  Violence- If anyone is threatening you, please tell your healthcare provider. Living Will/ Health care power of attorney- Speak with your healthcare provider and family.Hernia, Adult A hernia is the bulging of an organ or tissue through a weak spot in the muscles of the abdomen (abdominal wall). Hernias develop most often near the navel or groin. There are many kinds of hernias. Common kinds include:  Femoral hernia. This kind of hernia develops under the groin in the upper thigh area.  Inguinal hernia. This kind of hernia develops in the groin or scrotum.  Umbilical hernia. This kind of hernia develops near the navel.  Hiatal hernia. This kind of hernia causes part of the stomach to be pushed up into the chest.  Incisional hernia. This kind of hernia bulges through a scar from an abdominal surgery. CAUSES This condition may be caused by:  Heavy lifting.  Coughing over a long period of time.  Straining to have a bowel movement.  An incision made during an abdominal surgery.  A birth defect (congenital defect).  Excess weight or obesity.  Smoking.  Poor nutrition.  Cystic fibrosis.  Excess fluid in the abdomen.  Undescended testicles. SYMPTOMS Symptoms of a hernia include:  A lump on the abdomen. This is the first sign of a hernia. The lump may become more obvious with standing, straining, or coughing. It may get bigger over time if it is not treated or if the condition causing it is not treated.  Pain. A hernia is usually painless, but it may become painful over time if treatment is delayed. The pain is  usually dull and may get worse with standing or lifting heavy objects. Sometimes a hernia gets tightly squeezed in the weak spot (strangulated) or stuck there (incarcerated) and causes additional symptoms. These symptoms may include:  Vomiting.  Nausea.  Constipation.  Irritability. DIAGNOSIS A hernia may be diagnosed with:  A physical exam. During the exam your health care provider may ask you to cough or to make a specific movement, because a hernia is usually more visible when you move.  Imaging tests. These can include:  X-rays.  Ultrasound.  CT scan. TREATMENT A hernia that is small and painless may not need to be treated. A hernia that is large or painful may be treated with surgery. Inguinal hernias may be treated with surgery to prevent incarceration or strangulation. Strangulated hernias are always treated with surgery, because lack of Duffy to the trapped organ or tissue can cause it to die. Surgery to treat a hernia involves pushing the bulge back into place and repairing the weak part of the abdomen. HOME CARE INSTRUCTIONS  Avoid straining.  Do not lift anything heavier than 10 lb (4.5 kg).  Lift with your leg muscles, not your back muscles. This helps avoid strain.  When coughing, try to cough gently.  Prevent constipation. Constipation leads to straining with bowel movements, which can make a hernia worse or cause a hernia repair to break down. You can prevent constipation by:  Eating a high-fiber diet that includes plenty of fruits and vegetables.  Drinking enough fluids to keep your urine clear or pale yellow. Aim to drink 6-8 glasses of water per day.  Using a stool softener as directed by your health care provider.  Lose weight, if you are overweight.  Do not use any tobacco products, including cigarettes, chewing tobacco, or electronic cigarettes. If you need help quitting, ask your health care provider.  Keep all follow-up visits as directed by your  health care provider. This is important. Your health care provider may need to monitor your condition. SEEK MEDICAL CARE IF:  You have swelling, redness, and pain in the affected area.  Your bowel habits change. SEEK IMMEDIATE MEDICAL CARE IF:  You have a fever.  You have abdominal pain that is getting worse.  You feel nauseous or you vomit.  You cannot push the hernia back in place by gently pressing on it while you are lying down.  The hernia:  Changes in shape or size.  Is stuck outside the abdomen.  Becomes discolored.  Feels hard or tender.   This information is not intended to replace advice given to you by your health care provider. Make sure you discuss any questions you have with your health care provider.   Document Released: 05/31/2005 Document Revised: 06/21/2014 Document Reviewed: 04/10/2014 Elsevier Interactive Patient Education 2016 [employer].  Allergic Conjunctivitis Allergic conjunctivitis is inflammation of the clear membrane that covers the white part of your eye and the inner surface of your eyelid (conjunctiva), and it is caused by allergies. The Duffy vessels in the conjunctiva become inflamed, and this causes the eye to become red or pink, and it often causes itchiness in the eye. Allergic conjunctivitis cannot be spread by one person to another person (noncontagious). CAUSES This condition is caused by an allergic reaction. Common causes of an allergic reaction (allergens) include:  Dust.  Pollen.  Mold.  Animal dander or secretions. RISK FACTORS This condition is more likely to develop if you are exposed to high levels of allergens that cause the allergic reaction. This might include being outdoors when air pollen levels are high or being around animals that you are allergic to. SYMPTOMS Symptoms of this condition may include:  Eye redness.  Tearing of the eyes.  Watery eyes.  Itchy eyes.  Burning feeling in the eyes.  Clear  drainage from the eyes.  Swollen eyelids. DIAGNOSIS This condition may be diagnosed by medical history and physical exam. If you have drainage from your eyes, it may be tested to rule out other causes of conjunctivitis. TREATMENT Treatment for this condition often includes medicines. These may be eye drops, ointments, or oral medicines. They may be prescription medicines or over-the-counter medicines. HOME CARE INSTRUCTIONS  Take or apply medicines only as directed by your health care provider.  Do not touch or rub your eyes.  Do not wear contact lenses until the inflammation is gone. Wear glasses instead.  Do not wear eye makeup until the inflammation is gone.  Apply a cool, clean washcloth to your eye for 10-20 minutes, 3-4 times a day.  Try to avoid whatever allergen is causing the allergic reaction. SEEK MEDICAL CARE IF:  Your symptoms get worse.  You have pus draining from your  eye.  You have new symptoms.  You have a fever.   This information is not intended to replace advice given to you by your health care provider. Make sure you discuss any questions you have with your health care provider.   Document Released: 08/21/2002 Document Revised: 06/21/2014 Document Reviewed: 03/12/2014 Elsevier Interactive Patient Education 2016 Elsevier Inc. Generalized Anxiety Disorder Generalized anxiety disorder (GAD) is a mental disorder. It interferes with life functions, including relationships, work, and school. GAD is different from normal anxiety, which everyone experiences at some point in their lives in response to specific life events and activities. Normal anxiety actually helps [place] prepare for and get through these life events and activities. Normal anxiety goes away after the event or activity is over.  GAD causes anxiety that is not necessarily related to specific events or activities. It also causes excess anxiety in proportion to specific events or activities. The anxiety  associated with GAD is also difficult to control. GAD can vary from mild to severe. People with severe GAD can have intense waves of anxiety with physical symptoms (panic attacks).  SYMPTOMS The anxiety and worry associated with GAD are difficult to control. This anxiety and worry are related to many life events and activities and also occur more days than not for 6 months or longer. People with GAD also have three or more of the following symptoms (one or more in children):  Restlessness.   Fatigue.  Difficulty concentrating.   Irritability.  Muscle tension.  Difficulty sleeping or unsatisfying sleep. DIAGNOSIS GAD is diagnosed through an assessment by your health care provider. Your health care provider will ask you questions aboutyour mood,physical symptoms, and events in your life. Your health care provider may ask you about your medical history and use of alcohol or drugs, including prescription medicines. Your health care provider may also do a physical exam and Duffy tests. Certain medical conditions and the use of certain substances can cause symptoms similar to those associated with GAD. Your health care provider may refer you to a mental health specialist for further evaluation. TREATMENT The following therapies are usually used to treat GAD:   Medication. Antidepressant medication usually is prescribed for long-term daily control. Antianxiety medicines may be added in severe cases, especially when panic attacks occur.   Talk therapy (psychotherapy). Certain types of talk therapy can be helpful in treating GAD by providing support, education, and guidance. A form of talk therapy called cognitive behavioral therapy can teach you healthy ways to think about and react to daily life events and activities.  Stress managementtechniques. These include yoga, meditation, and exercise and can be very helpful when they are practiced regularly. A mental health specialist can help  determine which treatment is best for you. Some people see improvement with one therapy. However, other people require a combination of therapies.   This information is not intended to replace advice given to you by your health care provider. Make sure you discuss any questions you have with your health care provider.   Document Released: 09/25/2012 Document Revised: 06/21/2014 Document Reviewed: 09/25/2012 Elsevier Interactive Patient Education [employer].         IF you received an x-ray today, you will receive an invoice from [facility]. Please contact [facility] at [phone] with questions or concerns regarding your invoice.   IF you received labwork today, you will receive an invoice from [facility]. Please contact [facility] at [phone] with questions or concerns regarding your invoice.  Our billing staff will not be able to assist you with questions regarding bills from these companies.  You will be contacted with the lab results as soon as they are available. The fastest way to get your results is to activate your My Chart account. Instructions are located on the last page of this paperwork. If you have not heard from [place] regarding the results in 2 weeks, please contact this office.        I personally performed the services described in this documentation, which was scribed in my presence. The recorded information has been reviewed and considered, and addended by me as needed.   Signed,   [name], MD [facility].  02/28/16 12:09 PM

## 2016-02-26 NOTE — Patient Instructions (Addendum)
Start Optivar drops for possible allergic conjunctivitis. Call your eye care provider for follow-up in the next few weeks. Return if worsening symptoms sooner.  Your anxiety appears to not be treated at this time. Restart Celexa once per day, and BuSpar twice per day. If any increasing anxiety symptoms, or any manic symptoms as we discussed, return right away as you may need to have other medications or evaluation with psychiatry again. Follow-up in 3-4 weeks to discuss the symptoms further and recheck on medication.  I will refer you to the general surgeon to evaluate the area around the umbilicus as this is a possible hernia. See precautions below.  Restart cholesterol medication, and other medications as advised by your cardiologist to lessen chance of recurrent heart disease or stroke. Call cardiology for follow-up as you are overdue.  Shingles vaccine was printed if you would like to have that done after checking into the cost and insurance coverage to your pharmacy. If you change your mind about flu shot, return here or any practice in town providing that vaccine.  Do not take any further testosterone supplements over-the-counter. I will check a testosterone level today, but this will need to be repeated at least once between 8 and 10 in the morning if the level today is low. Your previous level overall looked okay, and only one percentage of it was borderline low.  Keeping you healthy  Get these tests  Blood pressure- Have your blood pressure checked once a year by your healthcare provider.  Normal blood pressure is 120/80  Weight- Have your body mass index (BMI) calculated to screen for obesity.  BMI is a measure of body fat based on height and weight. You can also calculate your own BMI at [email or web address].  Cholesterol- Have your cholesterol checked every year.  Diabetes- Have your blood sugar checked regularly if you have high blood pressure, high cholesterol, have a family  history of diabetes or if you are overweight.  Screening for Colon Cancer- Colonoscopy starting at age 39.  Screening may begin sooner depending on your family history and other health conditions. Follow up colonoscopy as directed by your Gastroenterologist.  Screening for Prostate Cancer- Both blood work (PSA) and a rectal exam help screen for Prostate Cancer.  Screening begins at age 86 with African-American men and at age 45 with Caucasian men.  Screening may begin sooner depending on your family history.  Take these medicines  Aspirin- One aspirin daily can help prevent Heart disease and Stroke.  Flu shot- Every fall.  Tetanus- Every 10 years.  Zostavax- Once after the age of 50 to prevent Shingles.  Pneumonia shot- Once after the age of 43; if you are younger than 86, ask your healthcare provider if you need a Pneumonia shot.  Take these steps  Don't smoke- If you do smoke, talk to your doctor about quitting.  For tips on how to quit, go to www.smokefree.gov or call 1-800-QUIT-NOW.  Be physically active- Exercise 5 days a week for at least 30 minutes.  If you are not already physically active start slow and gradually work up to 30 minutes of moderate physical activity.  Examples of moderate activity include walking briskly, mowing the yard, dancing, swimming, bicycling, etc.  Eat a healthy diet- Eat a variety of healthy food such as fruits, vegetables, low fat milk, low fat cheese, yogurt, lean meant, poultry, fish, beans, tofu, etc. For more information go to www.thenutritionsource.org  Drink alcohol in moderation- Limit alcohol intake to less than  two drinks a day. Never drink and drive.  Dentist- Brush and floss twice daily; visit your dentist twice a year.  Depression- Your emotional health is as important as your physical health. If you're feeling down, or losing interest in things you would normally enjoy please talk to your healthcare provider.  Eye exam- Visit your eye  doctor every year.  Safe sex- If you may be exposed to a sexually transmitted infection, use a condom.  Seat belts- Seat belts can save your life; always wear one.  Smoke/Carbon Monoxide detectors- These detectors need to be installed on the appropriate level of your home.  Replace batteries at least once a year.  Skin cancer- When out in the sun, cover up and use sunscreen 15 SPF or higher.  Violence- If anyone is threatening you, please tell your healthcare provider. Living Will/ Health care power of attorney- Speak with your healthcare provider and family.Hernia, Adult A hernia is the bulging of an organ or tissue through a weak spot in the muscles of the abdomen (abdominal wall). Hernias develop most often near the navel or groin. There are many kinds of hernias. Common kinds include:  Femoral hernia. This kind of hernia develops under the groin in the upper thigh area.  Inguinal hernia. This kind of hernia develops in the groin or scrotum.  Umbilical hernia. This kind of hernia develops near the navel.  Hiatal hernia. This kind of hernia causes part of the stomach to be pushed up into the chest.  Incisional hernia. This kind of hernia bulges through a scar from an abdominal surgery. CAUSES This condition may be caused by:  Heavy lifting.  Coughing over a long period of time.  Straining to have a bowel movement.  An incision made during an abdominal surgery.  A birth defect (congenital defect).  Excess weight or obesity.  Smoking.  Poor nutrition.  Cystic fibrosis.  Excess fluid in the abdomen.  Undescended testicles. SYMPTOMS Symptoms of a hernia include:  A lump on the abdomen. This is the first sign of a hernia. The lump may become more obvious with standing, straining, or coughing. It may get bigger over time if it is not treated or if the condition causing it is not treated.  Pain. A hernia is usually painless, but it may become painful over time if  treatment is delayed. The pain is usually dull and may get worse with standing or lifting heavy objects. Sometimes a hernia gets tightly squeezed in the weak spot (strangulated) or stuck there (incarcerated) and causes additional symptoms. These symptoms may include:  Vomiting.  Nausea.  Constipation.  Irritability. DIAGNOSIS A hernia may be diagnosed with:  A physical exam. During the exam your health care provider may ask you to cough or to make a specific movement, because a hernia is usually more visible when you move.  Imaging tests. These can include:  X-rays.  Ultrasound.  CT scan. TREATMENT A hernia that is small and painless may not need to be treated. A hernia that is large or painful may be treated with surgery. Inguinal hernias may be treated with surgery to prevent incarceration or strangulation. Strangulated hernias are always treated with surgery, because lack of blood to the trapped organ or tissue can cause it to die. Surgery to treat a hernia involves pushing the bulge back into place and repairing the weak part of the abdomen. HOME CARE INSTRUCTIONS  Avoid straining.  Do not lift anything heavier than 10 lb (4.5 kg).  Lift with your leg muscles, not your back muscles. This helps avoid strain.  When coughing, try to cough gently.  Prevent constipation. Constipation leads to straining with bowel movements, which can make a hernia worse or cause a hernia repair to break down. You can prevent constipation by:  Eating a high-fiber diet that includes plenty of fruits and vegetables.  Drinking enough fluids to keep your urine clear or pale yellow. Aim to drink 6-8 glasses of water per day.  Using a stool softener as directed by your health care provider.  Lose weight, if you are overweight.  Do not use any tobacco products, including cigarettes, chewing tobacco, or electronic cigarettes. If you need help quitting, ask your health care provider.  Keep all  follow-up visits as directed by your health care provider. This is important. Your health care provider may need to monitor your condition. SEEK MEDICAL CARE IF:  You have swelling, redness, and pain in the affected area.  Your bowel habits change. SEEK IMMEDIATE MEDICAL CARE IF:  You have a fever.  You have abdominal pain that is getting worse.  You feel nauseous or you vomit.  You cannot push the hernia back in place by gently pressing on it while you are lying down.  The hernia:  Changes in shape or size.  Is stuck outside the abdomen.  Becomes discolored.  Feels hard or tender.   This information is not intended to replace advice given to you by your health care provider. Make sure you discuss any questions you have with your health care provider.   Document Released: 05/31/2005 Document Revised: 06/21/2014 Document Reviewed: 04/10/2014 Elsevier Interactive Patient Education 2016 [employer].  Allergic Conjunctivitis Allergic conjunctivitis is inflammation of the clear membrane that covers the white part of your eye and the inner surface of your eyelid (conjunctiva), and it is caused by allergies. The blood vessels in the conjunctiva become inflamed, and this causes the eye to become red or pink, and it often causes itchiness in the eye. Allergic conjunctivitis cannot be spread by one person to another person (noncontagious). CAUSES This condition is caused by an allergic reaction. Common causes of an allergic reaction (allergens) include:  Dust.  Pollen.  Mold.  Animal dander or secretions. RISK FACTORS This condition is more likely to develop if you are exposed to high levels of allergens that cause the allergic reaction. This might include being outdoors when air pollen levels are high or being around animals that you are allergic to. SYMPTOMS Symptoms of this condition may include:  Eye redness.  Tearing of the eyes.  Watery eyes.  Itchy  eyes.  Burning feeling in the eyes.  Clear drainage from the eyes.  Swollen eyelids. DIAGNOSIS This condition may be diagnosed by medical history and physical exam. If you have drainage from your eyes, it may be tested to rule out other causes of conjunctivitis. TREATMENT Treatment for this condition often includes medicines. These may be eye drops, ointments, or oral medicines. They may be prescription medicines or over-the-counter medicines. HOME CARE INSTRUCTIONS  Take or apply medicines only as directed by your health care provider.  Do not touch or rub your eyes.  Do not wear contact lenses until the inflammation is gone. Wear glasses instead.  Do not wear eye makeup until the inflammation is gone.  Apply a cool, clean washcloth to your eye for 10-20 minutes, 3-4 times a day.  Try to avoid whatever allergen is causing the allergic reaction. [place]  CARE IF:  Your symptoms get worse.  You have pus draining from your eye.  You have new symptoms.  You have a fever.   This information is not intended to replace advice given to you by your health care provider. Make sure you discuss any questions you have with your health care provider.   Document Released: 08/21/2002 Document Revised: 06/21/2014 Document Reviewed: 03/12/2014 Elsevier Interactive Patient Education 2016 Elsevier Inc. Generalized Anxiety Disorder Generalized anxiety disorder (GAD) is a mental disorder. It interferes with life functions, including relationships, work, and school. GAD is different from normal anxiety, which everyone experiences at some point in their lives in response to specific life events and activities. Normal anxiety actually helps [place] prepare for and get through these life events and activities. Normal anxiety goes away after the event or activity is over.  GAD causes anxiety that is not necessarily related to specific events or activities. It also causes excess anxiety in proportion  to specific events or activities. The anxiety associated with GAD is also difficult to control. GAD can vary from mild to severe. People with severe GAD can have intense waves of anxiety with physical symptoms (panic attacks).  SYMPTOMS The anxiety and worry associated with GAD are difficult to control. This anxiety and worry are related to many life events and activities and also occur more days than not for 6 months or longer. People with GAD also have three or more of the following symptoms (one or more in children):  Restlessness.   Fatigue.  Difficulty concentrating.   Irritability.  Muscle tension.  Difficulty sleeping or unsatisfying sleep. DIAGNOSIS GAD is diagnosed through an assessment by your health care provider. Your health care provider will ask you questions aboutyour mood,physical symptoms, and events in your life. Your health care provider may ask you about your medical history and use of alcohol or drugs, including prescription medicines. Your health care provider may also do a physical exam and blood tests. Certain medical conditions and the use of certain substances can cause symptoms similar to those associated with GAD. Your health care provider may refer you to a mental health specialist for further evaluation. TREATMENT The following therapies are usually used to treat GAD:   Medication. Antidepressant medication usually is prescribed for long-term daily control. Antianxiety medicines may be added in severe cases, especially when panic attacks occur.   Talk therapy (psychotherapy). Certain types of talk therapy can be helpful in treating GAD by providing support, education, and guidance. A form of talk therapy called cognitive behavioral therapy can teach you healthy ways to think about and react to daily life events and activities.  Stress managementtechniques. These include yoga, meditation, and exercise and can be very helpful when they are practiced  regularly. A mental health specialist can help determine which treatment is best for you. Some people see improvement with one therapy. However, other people require a combination of therapies.   This information is not intended to replace advice given to you by your health care provider. Make sure you discuss any questions you have with your health care provider.   Document Released: 09/25/2012 Document Revised: 06/21/2014 Document Reviewed: 09/25/2012 Elsevier Interactive Patient Education [employer].         IF you received an x-ray today, you will receive an invoice from [facility]. Please contact [facility] at [phone] with questions or concerns regarding your invoice.   IF you received labwork today, you will receive an invoice from [facility]. Please contact [facility] at [phone] with questions or concerns regarding your invoice.   Our billing staff will not be able to assist you with questions regarding bills from these companies.  You will be contacted with the lab results as soon as they are available. The fastest way to get your results is to activate your My Chart account. Instructions are located on the last page of this paperwork. If you have not heard from [place] regarding the results in 2 weeks, please contact this office.

## 2016-02-27 LAB — TESTOSTERONE: TESTOSTERONE: 460 ng/dL (ref 250–827)

## 2016-02-27 LAB — HEPATITIS C ANTIBODY: HCV Ab: NEGATIVE

## 2016-08-23 ENCOUNTER — Other Ambulatory Visit: Payer: Self-pay | Admitting: Family Medicine

## 2016-08-23 DIAGNOSIS — Z7901 Long term (current) use of anticoagulants: Secondary | ICD-10-CM

## 2016-08-23 DIAGNOSIS — I2510 Atherosclerotic heart disease of native coronary artery without angina pectoris: Secondary | ICD-10-CM

## 2016-12-28 ENCOUNTER — Ambulatory Visit: Payer: BLUE CROSS/BLUE SHIELD | Admitting: Family Medicine

## 2017-04-25 ENCOUNTER — Inpatient Hospital Stay (HOSPITAL_COMMUNITY)
Admission: RE | Admit: 2017-04-25 | Discharge: 2017-05-02 | DRG: 885 | Disposition: A | Discharge status: Home / Self Care | Payer: 59 | Attending: Psychiatry | Admitting: Psychiatry

## 2017-04-25 ENCOUNTER — Encounter (HOSPITAL_COMMUNITY): Payer: Self-pay | Admitting: *Deleted

## 2017-04-25 ENCOUNTER — Other Ambulatory Visit: Payer: Self-pay

## 2017-04-25 DIAGNOSIS — E785 Hyperlipidemia, unspecified: Secondary | ICD-10-CM | POA: Diagnosis present

## 2017-04-25 DIAGNOSIS — F332 Major depressive disorder, recurrent severe without psychotic features: Principal | ICD-10-CM | POA: Diagnosis present

## 2017-04-25 DIAGNOSIS — I2510 Atherosclerotic heart disease of native coronary artery without angina pectoris: Secondary | ICD-10-CM | POA: Diagnosis present

## 2017-04-25 DIAGNOSIS — K219 Gastro-esophageal reflux disease without esophagitis: Secondary | ICD-10-CM | POA: Diagnosis present

## 2017-04-25 DIAGNOSIS — Z87891 Personal history of nicotine dependence: Secondary | ICD-10-CM | POA: Diagnosis not present

## 2017-04-25 DIAGNOSIS — Z955 Presence of coronary angioplasty implant and graft: Secondary | ICD-10-CM | POA: Diagnosis not present

## 2017-04-25 DIAGNOSIS — G4700 Insomnia, unspecified: Secondary | ICD-10-CM | POA: Diagnosis present

## 2017-04-25 DIAGNOSIS — R45851 Suicidal ideations: Secondary | ICD-10-CM | POA: Diagnosis present

## 2017-04-25 DIAGNOSIS — R450 Nervousness: Secondary | ICD-10-CM | POA: Diagnosis not present

## 2017-04-25 DIAGNOSIS — F39 Unspecified mood [affective] disorder: Secondary | ICD-10-CM | POA: Diagnosis not present

## 2017-04-25 DIAGNOSIS — F419 Anxiety disorder, unspecified: Secondary | ICD-10-CM | POA: Diagnosis not present

## 2017-04-25 DIAGNOSIS — F3181 Bipolar II disorder: Secondary | ICD-10-CM | POA: Diagnosis present

## 2017-04-25 DIAGNOSIS — Z818 Family history of other mental and behavioral disorders: Secondary | ICD-10-CM | POA: Diagnosis not present

## 2017-04-25 DIAGNOSIS — Z23 Encounter for immunization: Secondary | ICD-10-CM | POA: Diagnosis not present

## 2017-04-25 DIAGNOSIS — F411 Generalized anxiety disorder: Secondary | ICD-10-CM | POA: Diagnosis present

## 2017-04-25 DIAGNOSIS — R454 Irritability and anger: Secondary | ICD-10-CM | POA: Diagnosis not present

## 2017-04-25 DIAGNOSIS — R5383 Other fatigue: Secondary | ICD-10-CM | POA: Diagnosis not present

## 2017-04-25 MED ORDER — ALUM & MAG HYDROXIDE-SIMETH 200-200-20 MG/5ML PO SUSP: 30.0000 mL | ORAL | Status: DC | PRN

## 2017-04-25 MED ORDER — TRAZODONE HCL 50 MG PO TABS
50.0000 mg | ORAL_TABLET | Freq: Every evening | ORAL | Status: DC | PRN
  Administered 2017-04-25 – 2017-05-01 (×7): 50 mg via ORAL
  Filled 2017-04-25 (×7): qty 1

## 2017-04-25 MED ORDER — ACETAMINOPHEN 325 MG PO TABS
650.0000 mg | ORAL_TABLET | Freq: Four times a day (QID) | ORAL | Status: DC | PRN
  Administered 2017-04-26 – 2017-05-02 (×4): 650 mg via ORAL
  Filled 2017-04-25 (×4): qty 2

## 2017-04-25 MED ORDER — MAGNESIUM HYDROXIDE 400 MG/5ML PO SUSP: 30.0000 mL | Freq: Every day | ORAL | Status: DC | PRN

## 2017-04-25 MED ORDER — INFLUENZA VAC SPLIT QUAD 0.5 ML IM SUSY
0.5000 mL | PREFILLED_SYRINGE | INTRAMUSCULAR | Status: AC
  Administered 2017-04-26: 0.5 mL via INTRAMUSCULAR
  Filled 2017-04-25: qty 0.5

## 2017-04-25 MED ORDER — LORAZEPAM 1 MG PO TABS
1.0000 mg | ORAL_TABLET | Freq: Four times a day (QID) | ORAL | Status: DC | PRN
  Administered 2017-04-25 – 2017-04-26 (×3): 1 mg via ORAL
  Filled 2017-04-25 (×4): qty 1

## 2017-04-25 NOTE — Progress Notes (Signed)
Maurice Duffy is a 66 year old male pt admitted voluntarily. He reports on-going depression and anxiety and reports that he is feeling on the edge where he might do something and does not want that to happen but is able to contract for safety while in the hospital. He reports being on celexa and buspar and reports that he is suppose to be on plavix but not taking like he should do to finances. He reports that he wants to work on coping mechanisms while he is here as well as positive thinking. He reports that he lives at home with his wife and will go back there after discharge. [name] was oriented to the unit and safety maintained.

## 2017-04-25 NOTE — H&P (Signed)
Behavioral Health Medical Screening Exam  Maurice Duffy is an 66 y.o. male who presents as a walk in due to severe anxiety and depression. His daughter was present for assessment. Patient states "I have anxiety most of the day all day. It's making me having suicidal thoughts. I'm just not sure what I might do. I was started on celexa and buspar a week ago. That worked for me in the past but right now I'm just struggling so much." Due to severity of symptoms the patient has been admitted for inpatient psychiatric treatment to bed 404-1.   Total Time spent with patient: 20 minutes  Psychiatric Specialty Exam: Physical Exam  Constitutional: He is oriented to person, place, and time. He appears well-developed and well-nourished.  HENT:  Head: Normocephalic and atraumatic.  Neck: Normal range of motion.  Cardiovascular: Normal rate, regular rhythm, normal heart sounds and intact distal pulses.  Respiratory: Effort normal and breath sounds normal.  GI: Soft. Bowel sounds are normal.  Musculoskeletal: Normal range of motion.  Neurological: He is alert and oriented to person, place, and time.  Skin: Skin is warm and dry.    ROS  Blood pressure 137/74, pulse 67, temperature 97.7 F (36.5 C), resp. rate 18, SpO2 99 %.There is no height or weight on file to calculate BMI.  General Appearance: Casual  Eye Contact:  Good  Speech:  Clear and Coherent  Volume:  Normal  Mood:  Anxious  Affect:  Congruent  Thought Process:  Coherent and Goal Directed  Orientation:  Full (Time, Place, and Person)  Thought Content:  Rumination  Suicidal Thoughts:  Yes.  without intent/plan  Homicidal Thoughts:  No  Memory:  Immediate;   Good Recent;   Good Remote;   Good  Judgement:  Good  Insight:  Present  Psychomotor Activity:  Restlessness  Concentration: Concentration: Good and Attention Span: Good  Recall:  Good  Fund of Knowledge:Good  Language: Good  Akathisia:  No  Handed:  Right  AIMS (if  indicated):     Assets:  Communication Skills Desire for Improvement Financial Resources/Insurance Housing Intimacy Leisure Time Physical Health Resilience Social Support  Sleep:       Musculoskeletal: Strength & Muscle Tone: within normal limits Gait & Station: normal Patient leans: N/A  Blood pressure 137/74, pulse 67, temperature 97.7 F (36.5 C), resp. rate 18, SpO2 99 %.  Recommendations:  Based on my evaluation the patient does not appear to have an emergency medical condition.  Elmarie Shiley, NP 04/25/2017, 4:36 PM

## 2017-04-25 NOTE — BH Assessment (Signed)
Assessment Note  Maurice Duffy is a 66 y.o. male, presenting to [facility] as a walk in due to increased anxiety and associated SI. Pt has a hx of OP psychiatric treatment, but has not seen a psychiatrist in @ 2 years. Pt reports going to his primary doctor @ a month ago and, based on what he reported, was prescribed Lexapro. This made his symptoms worse with increased anxiety, mood swings, and SI. Pt remembered what he used to take (Celexa and Buspar) and called his primary doctor for a med change. The doctor changed the medication and advised pt to taper himself off of the Lexapro. Pt reports stopping the Lexapro all at once, as he didn't know how to taper it off. This was 4 days ago-pt d/c one medication and started on 2 new ones. For the past month, pt has been experiencing depressive symptoms, such as tearfulness, feelings of worthlessness, irritability, and anhedonia. Additionally, pt has been experiencing high levels of anxiety, where he is fearful to be alone at home b/c he doesn't know what he might do to himself. Pt denies any suicidal plan, but is having passive thoughts of suicide. No HI, AVH.   Diagnosis: Bipolar I d/o, current or most recent episode hypomanic  Past Medical History:  Past Medical History:  Diagnosis Date  . Anxiety   . Coronary artery disease   . Depression   . Hyperlipidemia     Past Surgical History:  Procedure Laterality Date  . CORONARY ANGIOPLASTY WITH STENT PLACEMENT    . HERNIA REPAIR    . NM MYOVIEW LTD  2013    Family History:  Family History  Problem Relation Age of Onset  . Coronary artery disease Father 52  . Cancer Father   . Heart disease Father   . Hyperlipidemia Father   . Hypertension Father     Social History:  reports that he quit smoking about 5 years ago. He has a 18.00 pack-year smoking history. He does not have any smokeless tobacco history on file. He reports that he does not drink alcohol or use drugs.  Additional Social History:   Alcohol / Drug Use Pain Medications: see PTA meds Prescriptions: see PTA meds Over the Counter: see PTA meds History of alcohol / drug use?: No history of alcohol / drug abuse  CIWA: CIWA-Ar BP: 137/74 Pulse Rate: 67 COWS:    Allergies: No Known Allergies  Home Medications:  Medications Prior to Admission  Medication Sig Dispense Refill  . azelastine (OPTIVAR) 0.05 % ophthalmic solution Place 1 drop into both eyes 2 (two) times daily. 6 mL 12  . busPIRone (BUSPAR) 15 MG tablet Take 1 tablet (15 mg total) by mouth 2 (two) times daily. 60 tablet 1  . citalopram (CELEXA) 20 MG tablet Take 1 tablet (20 mg total) by mouth daily. 30 tablet 1  . clopidogrel (PLAVIX) 75 MG tablet Take 1 tablet (75 mg total) by mouth daily. 90 tablet 0  . simvastatin (ZOCOR) 20 MG tablet Take 1 tablet (20 mg total) by mouth every evening. 90 tablet 0    OB/GYN Status:  No LMP for male patient.  General Assessment Data Location of Assessment: [facility] Assessment Services TTS Assessment: In system Is this a Tele or Face-to-Face Assessment?: Face-to-Face Is this an Initial Assessment or a Re-assessment for this encounter?: Initial Assessment Marital status: Married Living Arrangements: Spouse/significant other Can pt return to current living arrangement?: Yes Admission Status: Voluntary Is patient capable of signing voluntary admission?: Yes  Referral Source: Self/Family/Friend Insurance type: [employer]  Medical Screening Exam ([place]) Medical Exam completed: Yes  Crisis Care Plan Living Arrangements: Spouse/significant other Name of Psychiatrist: none Name of Therapist: none  Education Status Is patient currently in school?: No  Risk to self with the past 6 months Suicidal Ideation: Yes-Currently Present Has patient been a risk to self within the past 6 months prior to admission? : No Suicidal Intent: No Has patient had any suicidal intent within the past 6 months prior to admission? : No Is  patient at risk for suicide?: Yes Suicidal Plan?: No Has patient had any suicidal plan within the past 6 months prior to admission? : No Access to Means: Yes Previous Attempts/Gestures: No Intentional Self Injurious Behavior: None Family Suicide History: Unknown Recent stressful life event(s): Other (Comment)(medication changes) Persecutory voices/beliefs?: No Depression: Yes Depression Symptoms: Tearfulness, Guilt, Isolating, Feeling angry/irritable Substance abuse history and/or treatment for substance abuse?: No Suicide prevention information given to non-admitted patients: Not applicable  Risk to Others within the past 6 months Homicidal Ideation: No Does patient have any lifetime risk of violence toward others beyond the six months prior to admission? : No Thoughts of Harm to Others: No Current Homicidal Intent: No Current Homicidal Plan: No Access to Homicidal Means: No History of harm to others?: No Assessment of Violence: None Noted Does patient have access to weapons?: No Criminal Charges Pending?: No Does patient have a court date: No Is patient on probation?: No  Psychosis Hallucinations: None noted Delusions: None noted  Mental Status Report Appearance/Hygiene: Unremarkable Eye Contact: Fair Motor Activity: Hyperactivity Speech: Rapid, Pressured Level of Consciousness: Alert Mood: Anxious, Pleasant Affect: Anxious, Appropriate to circumstance Anxiety Level: Severe Thought Processes: Coherent, Relevant Judgement: Partial Orientation: Person, Place, Time, Situation, Appropriate for developmental age Obsessive Compulsive Thoughts/Behaviors: None  Cognitive Functioning Concentration: Good Memory: Recent Intact, Remote Intact IQ: Average Insight: Good Impulse Control: Unable to Assess Appetite: Fair Sleep: (intermittent sleep) Vegetative Symptoms: None  ADLScreening [facility] Assessment Services) Patient's cognitive ability adequate to safely complete daily  activities?: Yes Patient able to express need for assistance with ADLs?: Yes Independently performs ADLs?: Yes (appropriate for developmental age)  Prior Inpatient Therapy Prior Inpatient Therapy: No  Prior Outpatient Therapy Prior Outpatient Therapy: Yes Prior Therapy Dates: 2 years ago/25 years ago Prior Therapy Facilty/Provider(s): Dr. Lujean Amel Reason for Treatment: anxiety/depression Does patient have an ACCT team?: No Does patient have Intensive In-House Services?  : No Does patient have [facility] services? : No Does patient have P4CC services?: No  ADL Screening (condition at time of admission) Patient's cognitive ability adequate to safely complete daily activities?: Yes Is the patient deaf or have difficulty hearing?: No Does the patient have difficulty seeing, even when wearing glasses/contacts?: No Does the patient have difficulty concentrating, remembering, or making decisions?: No Patient able to express need for assistance with ADLs?: Yes Does the patient have difficulty dressing or bathing?: No Independently performs ADLs?: Yes (appropriate for developmental age) Does the patient have difficulty walking or climbing stairs?: No Weakness of Legs: None Weakness of Arms/Hands: None  Home Assistive Devices/Equipment Home Assistive Devices/Equipment: None    Abuse/Neglect Assessment (Assessment to be complete while patient is alone) Abuse/Neglect Assessment Can Be Completed: Yes Physical Abuse: Denies Verbal Abuse: Denies Sexual Abuse: Denies Exploitation of patient/patient's resources: Denies Self-Neglect: Denies Values / Beliefs Cultural Requests During Hospitalization: None Spiritual Requests During Hospitalization: None   Advance Directives (For Healthcare) Does Patient Have a Medical Advance Directive?: No Would  patient like information on creating a medical advance directive?: No - Patient declined Nutrition Screen- [place] Adult/[place]/AP Patient's home diet:  Regular Has the patient recently lost weight without trying?: No Has the patient been eating poorly because of a decreased appetite?: No Malnutrition Screening Tool Score: 0  Additional Information 1:1 In Past 12 Months?: No CIRT Risk: No Elopement Risk: No Does patient have medical clearance?: Yes     Disposition:  Disposition Initial Assessment Completed for this Encounter: Yes(consulted with Elmarie Shiley, PMHNP) Disposition of Patient: Inpatient treatment program Type of inpatient treatment program: Adult(Pt accepted to [facility]  On Site Evaluation by:   Reviewed with Physician:    Rexene Edison 04/25/2017 4:45 PM

## 2017-04-25 NOTE — Tx Team (Signed)
Initial Treatment Plan 04/25/2017 5:32 PM [name] MAY:[id number]    PATIENT STRESSORS: Financial difficulties Health problems   PATIENT STRENGTHS: Average or above average intelligence Communication skills Supportive family/friends   PATIENT IDENTIFIED PROBLEMS: "work on anxiety and coping mechanisms"  "positive thinking and reinforcements"  depression  anxiety               DISCHARGE CRITERIA:  Motivation to continue treatment in a less acute level of care Need for constant or close observation no longer present Verbal commitment to aftercare and medication compliance  PRELIMINARY DISCHARGE PLAN: Outpatient therapy Return to previous living arrangement  PATIENT/FAMILY INVOLVEMENT: This treatment plan has been presented to and reviewed with the patient, Maurice Duffy.  The patient and family have been given the opportunity to ask questions and make suggestions.  Zipporah Plants, RN 04/25/2017, 5:32 PM

## 2017-04-26 DIAGNOSIS — R454 Irritability and anger: Secondary | ICD-10-CM

## 2017-04-26 DIAGNOSIS — Z818 Family history of other mental and behavioral disorders: Secondary | ICD-10-CM

## 2017-04-26 DIAGNOSIS — F419 Anxiety disorder, unspecified: Secondary | ICD-10-CM

## 2017-04-26 DIAGNOSIS — F39 Unspecified mood [affective] disorder: Secondary | ICD-10-CM

## 2017-04-26 DIAGNOSIS — F332 Major depressive disorder, recurrent severe without psychotic features: Principal | ICD-10-CM

## 2017-04-26 DIAGNOSIS — R5383 Other fatigue: Secondary | ICD-10-CM

## 2017-04-26 LAB — COMPREHENSIVE METABOLIC PANEL
ALT: 29 U/L (ref 17–63)
ANION GAP: 5 (ref 5–15)
AST: 26 U/L (ref 15–41)
Albumin: 3.8 g/dL (ref 3.5–5.0)
Alkaline Phosphatase: 65 U/L (ref 38–126)
BUN: 18 mg/dL (ref 6–20)
CALCIUM: 8.7 mg/dL — AB (ref 8.9–10.3)
CHLORIDE: 112 mmol/L — AB (ref 101–111)
CO2: 22 mmol/L (ref 22–32)
Creatinine, Ser: 0.86 mg/dL (ref 0.61–1.24)
GFR calc non Af Amer: >60 mL/min (ref >60)
Glucose, Bld: 104 mg/dL — ABNORMAL HIGH (ref 65–99)
POTASSIUM: 4 mmol/L (ref 3.5–5.1)
SODIUM: 139 mmol/L (ref 135–145)
Total Bilirubin: 0.7 mg/dL (ref 0.3–1.2)
Total Protein: 6.3 g/dL — ABNORMAL LOW (ref 6.5–8.1)

## 2017-04-26 LAB — CBC
HCT: 41.9 % (ref 39.0–52.0)
Hemoglobin: 13.9 g/dL (ref 13.0–17.0)
MCH: 29.8 pg (ref 26.0–34.0)
MCHC: 33.2 g/dL (ref 30.0–36.0)
MCV: 89.9 fL (ref 78.0–100.0)
PLATELETS: 258 10*3/uL (ref 150–400)
RBC: 4.66 MIL/uL (ref 4.22–5.81)
RDW: 13.5 % (ref 11.5–15.5)
WBC: 6.2 10*3/uL (ref 4.0–10.5)

## 2017-04-26 LAB — RAPID URINE DRUG SCREEN, HOSP PERFORMED
AMPHETAMINES: NOT DETECTED
BARBITURATES: NOT DETECTED
BENZODIAZEPINES: NOT DETECTED
COCAINE: NOT DETECTED
Opiates: NOT DETECTED
TETRAHYDROCANNABINOL: NOT DETECTED

## 2017-04-26 LAB — TSH: TSH: 2.408 u[IU]/mL (ref 0.350–4.500)

## 2017-04-26 MED ORDER — CITALOPRAM HYDROBROMIDE 10 MG PO TABS
10.0000 mg | ORAL_TABLET | Freq: Every day | ORAL | Status: DC
  Administered 2017-04-26 – 2017-04-28 (×3): 10 mg via ORAL
  Filled 2017-04-26 (×5): qty 1

## 2017-04-26 MED ORDER — BUSPIRONE HCL 5 MG PO TABS
5.0000 mg | ORAL_TABLET | Freq: Three times a day (TID) | ORAL | Status: DC
  Administered 2017-04-26 – 2017-04-28 (×6): 5 mg via ORAL
  Filled 2017-04-26 (×9): qty 1

## 2017-04-26 NOTE — Progress Notes (Signed)
Recreation Therapy Notes  Animal-Assisted Activity (AAA) Program Checklist/Progress Notes Patient Eligibility Criteria Checklist & Daily Group note for Rec TxIntervention  Date: 11.13.2018 Time: 2:45pm Location: [address]   AAA/T Program Assumption of Risk Form signed by Patient/ or Parent Legal Guardian Yes  Patient is free of allergies or sever asthma Yes  Patient reports no fear of animals Yes  Patient reports no history of cruelty to animals Yes  Patient understands his/her participation is voluntary Yes  Patient washes hands before animal contact Yes  Patient washes hands after animal contact Yes  Behavioral Response: Appropriate   Education:Hand Washing, Appropriate Animal Interaction   Education Outcome: Acknowledges education.   Clinical Observations/Feedback: Patient attended session and interacted appropriately with therapy dog and peers.   Maurice Duffy Maurice Duffy, LRT/CTRS        [name] 04/26/2017 3:09 PM

## 2017-04-26 NOTE — H&P (Signed)
Psychiatric Admission Assessment Adult  Patient Identification: Maurice Duffy MRN:  [id number] Date of Evaluation:  04/26/2017 Chief Complaint: reports worsening depression and anxiety  Principal Diagnosis:  MDD, no psychotic features  Diagnosis:   Patient Active Problem List   Diagnosis Date Noted  . MDD (major depressive disorder), recurrent episode, severe ([place]) [F33.2] 04/25/2017  . GERD (gastroesophageal reflux disease) [K21.9] 08/22/2014  . H/O left inguinal hernia repair [B34.193, Z87.19] 08/22/2014  . Hyperlipidemia [E78.5] 09/14/2013  . Bipolar 2 disorder ([place]) [F31.81] 12/22/2011  . Obesity [E66.9] 12/22/2011  . CAD (coronary artery disease) [I25.10] 12/22/2011   History of Present Illness: 66 year old married male, who reports long history of mood disorder ( depression), and anxiety, which he describes as a free floating sense of anxiety, worry . States that he had been off psychiatric medications for a long period of time due to cost, insurance constraints. He states he has been experiencing increased depression and anxiety recently , which he attributes partly to concerns about his wife's physical health ( has RA), and due to political issues, mainly Nurse, adult. States " I just got so caught up with Kavanaugh's hearing, and I started getting more anxious about the whole thing".  Presented to the hospital voluntarily due to worsening anxiety, depression, and recent suicidal ideations, without any specific plan or intention. States he has been in the process of changing psychiatric medications from Lexapro to [place] via his PCP. States he has been on Celexa and Buspar x a few days, prior to which he had been on Lexapro x 1 month with no response . Endorses neuro- vegetative symptoms of depression as below.   Associated Signs/Symptoms: Depression Symptoms:  depressed mood, anhedonia, insomnia, suicidal thoughts without plan, anxiety, loss of  energy/fatigue, decreased appetite, (Hypo) Manic Symptoms:  Mild irritability  Anxiety Symptoms:  Denies panic or agoraphobia, endorses excessive worrying , vague but persistent sense of anxiety  Psychotic Symptoms:  Denies  PTSD Symptoms: Denies  Total Time spent with patient: 45 minutes  Past Psychiatric History: reports long history of depression and anxiety . Reports he was first diagnosed with depression 25 years ago. States he has never attempted suicide. Denies history of self cutting . Denies history of psychosis. No prior psychiatric admissions . Denies history of violence . Denies any history of mania or hypomania.  Is the patient at risk to self? Yes.    Has the patient been a risk to self in the past 6 months? No.  Has the patient been a risk to self within the distant past? No.  Is the patient a risk to others? No.  Has the patient been a risk to others in the past 6 months? No.  Has the patient been a risk to others within the distant past? No.   Prior Inpatient Therapy: Prior Inpatient Therapy: No Prior Outpatient Therapy: Prior Outpatient Therapy: Yes Prior Therapy Dates: 2 years ago/25 years ago Prior Therapy Facilty/Provider(s): Dr. Lujean Amel Reason for Treatment: anxiety/depression Does patient have an ACCT team?: No Does patient have Intensive In-House Services?  : No Does patient have [facility] services? : No Does patient have P4CC services?: No  Alcohol Screening: 1. How often do you have a drink containing alcohol?: Monthly or less 2. How many drinks containing alcohol do you have on a typical day when you are drinking?: 1 or 2 3. How often do you have six or more drinks on one occasion?: Never AUDIT-C Score: 1 4. How  often during the last year have you found that you were not able to stop drinking once you had started?: Never 5. How often during the last year have you failed to do what was normally expected from you becasue of drinking?: Never 6. How often  during the last year have you needed a first drink in the morning to get yourself going after a heavy drinking session?: Never 7. How often during the last year have you had a feeling of guilt of remorse after drinking?: Never 8. How often during the last year have you been unable to remember what happened the night before because you had been drinking?: Never 9. Have you or someone else been injured as a result of your drinking?: No 10. Has a relative or friend or a doctor or another health worker been concerned about your drinking or suggested you cut down?: No Alcohol Use Disorder Identification Test Final Score (AUDIT): 1 Intervention/Follow-up: AUDIT Score <7 follow-up not indicated Substance Abuse History in the last 12 months: denies alcohol or drug abuse  Consequences of Substance Abuse: Denies  Previous Psychotropic Medications: Patient states he had been on combination of Buspar and Celexa in the past, with good result. More recently he had been tried on Lexapro, Psychological Evaluations:  No  Past Medical History:  Past Medical History:  Diagnosis Date  . Anxiety   . Coronary artery disease   . Depression   . Hyperlipidemia     Past Surgical History:  Procedure Laterality Date  . CORONARY ANGIOPLASTY WITH STENT PLACEMENT    . HERNIA REPAIR    . NM MYOVIEW LTD  2013   Family History: parents are both passed away . He has two sisters .  Family History  Problem Relation Age of Onset  . Coronary artery disease Father 71  . Cancer Father   . Heart disease Father   . Hyperlipidemia Father   . Hypertension Father    Family Psychiatric  History: reports father had combat related PTSD, and great grandfather committed suicide  Tobacco Screening: Have you used any form of tobacco in the last 30 days? (Cigarettes, Smokeless Tobacco, Cigars, and/or Pipes): No Social History: married , lives with wife, has an adult daughter, retired Therapist, sports, but  employed part time . Denies legal issues  . Social History   Substance and Sexual Activity  Alcohol Use No     Social History   Substance and Sexual Activity  Drug Use No    Additional Social History: Marital status: Married Number of Years Married: 67 What types of issues is patient dealing with in the relationship?: wife is a Marine scientist and has been very supportive Are you sexually active?: Yes What is your sexual orientation?: straight Has your sexual activity been affected by drugs, alcohol, medication, or emotional stress?: we've been abstaining because of wife's rheumatoid arthritis-she's been going downhill pyhsically Does patient have children?: Yes How many children?: 1 How is patient's relationship with their children?: 67  wonderful relationship-she's married  "She says I try to live in the past with her too much"    Pain Medications: see PTA meds Prescriptions: see PTA meds Over the Counter: see PTA meds History of alcohol / drug use?: No history of alcohol / drug abuse  Allergies:  No Known Allergies Lab Results:  Results for orders placed or performed during the hospital encounter of 04/25/17 (from the past 48 hour(s))  Urine rapid drug screen (hosp performed)not at [facility]     Status:  None   Collection Time: 04/25/17  6:12 PM  Result Value Ref Range   Opiates NONE DETECTED NONE DETECTED   Cocaine NONE DETECTED NONE DETECTED   Benzodiazepines NONE DETECTED NONE DETECTED   Amphetamines NONE DETECTED NONE DETECTED   Tetrahydrocannabinol NONE DETECTED NONE DETECTED   Barbiturates NONE DETECTED NONE DETECTED    Comment:        DRUG SCREEN FOR MEDICAL PURPOSES ONLY.  IF CONFIRMATION IS NEEDED FOR ANY PURPOSE, NOTIFY LAB WITHIN 5 DAYS.        LOWEST DETECTABLE LIMITS FOR URINE DRUG SCREEN Drug Class       Cutoff (ng/mL) Amphetamine      1000 Barbiturate      200 Benzodiazepine   277 Tricyclics       824 Opiates          300 Cocaine          300 THC              50 Performed at [facility], [place] [address] [place], [place] [address]   CBC     Status: None   Collection Time: 04/26/17  6:11 AM  Result Value Ref Range   WBC 6.2 4.0 - 10.5 K/uL   RBC 4.66 4.22 - 5.81 MIL/uL   Hemoglobin 13.9 13.0 - 17.0 g/dL   HCT 41.9 39.0 - 52.0 %   MCV 89.9 78.0 - 100.0 fL   MCH 29.8 26.0 - 34.0 pg   MCHC 33.2 30.0 - 36.0 g/dL   RDW 13.5 11.5 - 15.5 %   Platelets 258 150 - 400 K/uL    Comment: Performed at [facility], [place] [address] [place], [place] [address]  Comprehensive metabolic panel     Status: Abnormal   Collection Time: 04/26/17  6:11 AM  Result Value Ref Range   Sodium 139 135 - 145 mmol/L   Potassium 4.0 3.5 - 5.1 mmol/L   Chloride 112 (H) 101 - 111 mmol/L   CO2 22 22 - 32 mmol/L   Glucose, Bld 104 (H) 65 - 99 mg/dL   BUN 18 6 - 20 mg/dL   Creatinine, Ser 0.86 0.61 - 1.24 mg/dL   Calcium 8.7 (L) 8.9 - 10.3 mg/dL   Total Protein 6.3 (L) 6.5 - 8.1 g/dL   Albumin 3.8 3.5 - 5.0 g/dL   AST 26 15 - 41 U/L   ALT 29 17 - 63 U/L   Alkaline Phosphatase 65 38 - 126 U/L   Total Bilirubin 0.7 0.3 - 1.2 mg/dL   GFR calc non Af Amer >60 >60 mL/min   GFR calc Af Amer >60 >60 mL/min    Comment: (NOTE) The eGFR has been calculated using the CKD EPI equation. This calculation has not been validated in all clinical situations. eGFR's persistently <60 mL/min signify possible Chronic Kidney Disease.    Anion gap 5 5 - 15    Comment: Performed at [facility], [place] [address] [place], [place] [address]  TSH     Status: None   Collection Time: 04/26/17  6:11 AM  Result Value Ref Range   TSH 2.408 0.350 - 4.500 uIU/mL    Comment: Performed by a 3rd Generation assay with a functional sensitivity of <=0.01 uIU/mL. Performed at [facility], [place] [address], [place], [place] [address]     Blood Alcohol level:  No results found for: [facility]  Metabolic Disorder Labs:   No  results found for: PROLACTIN Lab Results   Component Value Date   CHOL 186 02/26/2016   TRIG 250 (H) 02/26/2016   HDL 42 02/26/2016   CHOLHDL 4.4 02/26/2016   VLDL 50 (H) 02/26/2016   LDLCALC 94 02/26/2016   LDLCALC 53 08/22/2014    Current Medications: Current Facility-Administered Medications  Medication Dose Route Frequency Provider Last Rate Last Dose  . acetaminophen (TYLENOL) tablet 650 mg  650 mg Oral Q6H PRN Niel Hummer, NP   650 mg at 04/26/17 0973  . alum & mag hydroxide-simeth (MAALOX/MYLANTA) 200-200-20 MG/5ML suspension 30 mL  30 mL Oral Q4H PRN Elmarie Shiley A, NP      . LORazepam (ATIVAN) tablet 1 mg  1 mg Oral Q6H PRN Elmarie Shiley A, NP   1 mg at 04/26/17 1500  . magnesium hydroxide (MILK OF MAGNESIA) suspension 30 mL  30 mL Oral Daily PRN Niel Hummer, NP      . traZODone (DESYREL) tablet 50 mg  50 mg Oral QHS PRN Niel Hummer, NP   50 mg at 04/25/17 2223   PTA Medications: Medications Prior to Admission  Medication Sig Dispense Refill Last Dose  . aspirin 325 MG tablet Take 325 mg daily by mouth.   04/24/2017  . busPIRone (BUSPAR) 15 MG tablet Take 1 tablet (15 mg total) by mouth 2 (two) times daily. (Patient taking differently: Take 7.5 mg 2 (two) times daily by mouth. ) 60 tablet 1 04/25/2017  . citalopram (CELEXA) 20 MG tablet Take 1 tablet (20 mg total) by mouth daily. (Patient taking differently: Take 20 mg at bedtime by mouth. ) 30 tablet 1 04/24/2017  . azelastine (OPTIVAR) 0.05 % ophthalmic solution Place 1 drop into both eyes 2 (two) times daily. (Patient not taking: Reported on 04/26/2017) 6 mL 12 Not Taking at Unknown time  . clopidogrel (PLAVIX) 75 MG tablet Take 1 tablet (75 mg total) by mouth daily. (Patient not taking: Reported on 04/26/2017) 90 tablet 0 Not Taking at Unknown time  . simvastatin (ZOCOR) 20 MG tablet Take 1 tablet (20 mg total) by mouth every evening. (Patient not taking: Reported on 04/26/2017) 90 tablet 0 Not Taking at Unknown time    Musculoskeletal: Strength & Muscle  Tone: within normal limits Gait & Station: normal Patient leans: N/A  Psychiatric Specialty Exam: Physical Exam  Review of Systems  Constitutional: Negative.   HENT: Negative.   Eyes: Negative.   Respiratory: Negative.   Cardiovascular: Negative.   Gastrointestinal: Negative.  Negative for blood in stool.  Genitourinary: Negative.   Musculoskeletal: Negative.   Skin: Negative.   Neurological: Negative for seizures.  Endo/Heme/Allergies: Negative.   Psychiatric/Behavioral: Positive for depression and suicidal ideas. The patient is nervous/anxious.   All other systems reviewed and are negative.   Blood pressure 122/70, pulse 74, temperature 97.7 F (36.5 C), temperature source Oral, resp. rate 18, height _0  (1.753 m), weight 92.5 kg (204 lb), SpO2 99 %.Body mass index is 30.13 kg/m.  General Appearance: Fairly Groomed  Eye Contact:  Good  Speech:  pressured at times   Volume:  Normal  Mood:  states that today he is feeling better, less depressed   Affect:  Appropriate and reactive, vaguely anxious   Thought Process:  Linear and Descriptions of Associations: Intact  Orientation:  Other:  fully alert and attentive   Thought Content:  denies hallucinations, no delusions   Suicidal Thoughts:  No denies suicidal ideations at this time, denies self injurious  ideations, contracts for safety on unit   Homicidal Thoughts:  No denies homicidal or violent ideations   Memory:  recent and remote grossly intact   Judgement:  Fair  Insight:  Fair  Psychomotor Activity:  Normal  Concentration:  Concentration: Good and Attention Span: Good  Recall:  Good  Fund of Knowledge:  Good  Language:  Good  Akathisia:  Negative  Handed:  Right  AIMS (if indicated):     Assets:  Communication Skills Desire for Improvement Resilience Social Support  ADL's:  Intact  Cognition:  WNL  Sleep:  Number of Hours: 5.5    Treatment Plan Summary: Daily contact with patient to assess and evaluate  symptoms and progress in treatment, Medication management, Plan inpatient treatment  and medications as below  Observation Level/Precautions:  15 minute checks  Laboratory:  as needed  Check EKG   Psychotherapy: milieu, group therapy     Medications: Currently on Ativan 1 mgr Q 6 hours PRN for short term management of anxiety. Patient states Celexa and Buspar were helpful in the past, and is wanting to continue this regimen. Start Celexa 10 mgrs QDAY and Buspar 5 mgrs TID.    Consultations:  As needed    Discharge Concerns:  -  Estimated LOS: 5-6 days   Other:     Physician Treatment Plan for Primary Diagnosis:  MDD, no Psychotic Features  Long Term Goal(s): Improvement in symptoms so as ready for discharge  Short Term Goals: Ability to verbalize feelings will improve, Ability to disclose and discuss suicidal ideas, Ability to demonstrate self-control will improve, Ability to identify and develop effective coping behaviors will improve and Ability to maintain clinical measurements within normal limits will improve  Physician Treatment Plan for Secondary Diagnosis: Anxiety, consider GAD Long Term Goal(s): Improvement in symptoms so as ready for discharge  Short Term Goals: Ability to identify changes in lifestyle to reduce recurrence of condition will improve and Ability to identify triggers associated with substance abuse/mental health issues will improve  I certify that inpatient services furnished can reasonably be expected to improve the patient's condition.    Jenne Campus, MD 11/13/20184:36 PM

## 2017-04-26 NOTE — BHH Counselor (Signed)
Adult Comprehensive Assessment  Patient ID: Maurice Duffy, male   DOB: 11-10-1950, 66 y.o.   MRN: [id number]  Information Source: Information source: Patient  Current Stressors:  Educational / Learning stressors: None Reported Employment / Job issues: Employed Family Relationships: Magazine features editor / Lack of resources (include bankruptcy): Some financial stressors Housing / Lack of housing: None Reported Physical health (include injuries & life threatening diseases): None Reported Social relationships: None Reported Substance abuse: None Reported Bereavement / Loss: None Reported  Living/Environment/Situation:  Living Arrangements: Spouse/significant other, Children Living conditions (as described by patient or guardian): good How long has patient lived in current situation?: 4-5 years, moved from [facility] [place] What is atmosphere in current home: Comfortable, Supportive, Loving  Family History:  Marital status: Married Number of Years Married: 83 What types of issues is patient dealing with in the relationship?: wife is a Marine scientist and has been very supportive Are you sexually active?: Yes What is your sexual orientation?: straight Has your sexual activity been affected by drugs, alcohol, medication, or emotional stress?: we've been abstaining because of wife's rheumatoid arthritis-she's been going downhill pyhsically Does patient have children?: Yes How many children?: 1 How is patient's relationship with their children?: 32  wonderful relationship-she's married  "She says I try to live in the past with her too much"  Childhood History:  By whom was/is the patient raised?: Both parents Description of patient's relationship with caregiver when they were a child: good-little dicey with father-"he was a black and white thinker" Patient's description of current relationship with people who raised him/her: both paased-daddy was sick-heart attack, mommy died  71 years ago Does patient have  siblings?: Yes Number of Siblings: 2 Description of patient's current relationship with siblings: good relationship with younger sister Did patient suffer any verbal/emotional/physical/sexual abuse as a child?: No Did patient suffer from severe childhood neglect?: No Has patient ever been sexually abused/assaulted/raped as an adolescent or adult?: No Was the patient ever a victim of a crime or a disaster?: No Witnessed domestic violence?: No Has patient been effected by domestic violence as an adult?: No  Education:  Highest grade of school patient has completed: got a 2 years nursing degree Currently a student?: No Learning disability?: No  Employment/Work Situation:   Employment situation: Retired Archivist job has been impacted by current illness: No What is the longest time patient has a held a job?: been retired 2 years-working part time at [employer]- prior to that 30 years 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 Has patient ever been in the [employer]?: No Are There Guns or Other Weapons in [place]?: No  Financial Resources:   Financial resources: Income from employment, Income from spouse, Receives SSI Does patient have a representative payee or guardian?: No  Alcohol/Substance Abuse:   What has been your use of drugs/alcohol within the last 12 months?: Drink no alcohol, use no drugs If attempted suicide, did drugs/alcohol play a role in this?: No  Social Support System:   Heritage manager System: Manufacturing engineer System: church family, family Type of faith/religion: Chrsitian How does patient's faith help to cope with current illness?: "If you know Jesus Christ as your Writer, you are going to heaven."-also leads me to make good decsions  Leisure/Recreation:   Leisure and Hobbies: like to go to gym-24 hr fitness  Strengths/Needs:   What things does the patient do well?: workng  out In what areas  does patient struggle / problems for patient: discouragement and depression, tend to be too perfectionistic  Discharge Plan:   Does patient have access to transportation?: Yes Will patient be returning to same living situation after discharge?: Yes Currently receiving community mental health services: No If no, would patient like referral for services when discharged?: Yes (What county?)([place]) Does patient have financial barriers related to discharge medications?: No  Summary/Recommendations:   Summary and Recommendations (to be completed by the evaluator): Client is a 66 year old Caucasian male admitted to [facility] due to increased anxiety and depression symptoms. He presents with pressured speech and anxious affect. At discharge client will attend a scheduled appointment with his OP psychiatrist for medication management. While here, Maurice Duffy can benefit from crises stabilization, medication management, therapeutic milieu and referral for services.  Trish Mage. 04/26/2017

## 2017-04-26 NOTE — BHH Suicide Risk Assessment (Signed)
[facility] Admission Suicide Risk Assessment   Nursing information obtained from:   patient and chart  Demographic factors:   66 year old married male  Current Mental Status:   see below  Loss Factors:   none acute  Historical Factors:   depression, anxiety  Risk Reduction Factors:   resilience, social support   Total Time spent with patient: 45 minutes Principal Problem: MDD, no psychotic features, consider GAD  Diagnosis:   Patient Active Problem List   Diagnosis Date Noted  . MDD (major depressive disorder), recurrent episode, severe ([place]) [F33.2] 04/25/2017  . GERD (gastroesophageal reflux disease) [K21.9] 08/22/2014  . H/O left inguinal hernia repair [Q19.758, Z87.19] 08/22/2014  . Hyperlipidemia [E78.5] 09/14/2013  . Bipolar 2 disorder ([place]) [F31.81] 12/22/2011  . Obesity [E66.9] 12/22/2011  . CAD (coronary artery disease) [I25.10] 12/22/2011    Continued Clinical Symptoms:  Alcohol Use Disorder Identification Test Final Score (AUDIT): 1 The "Alcohol Use Disorders Identification Test", Guidelines for Use in Primary Care, Second Edition.  World Pharmacologist [facility]). Score between 0-7:  no or low risk or alcohol related problems. Score between 8-15:  moderate risk of alcohol related problems. Score between 16-19:  high risk of alcohol related problems. Score 20 or above:  warrants further diagnostic evaluation for alcohol dependence and treatment.   CLINICAL FACTORS:  66 year old married male, presented due to worsening depression, anxiety, vague suicidal ideations .    Psychiatric Specialty Exam: Physical Exam  ROS  Blood pressure 122/70, pulse 74, temperature 97.7 F (36.5 C), temperature source Oral, resp. rate 18, height 5\' 9"  (1.753 m), weight 92.5 kg (204 lb), SpO2 99 %.Body mass index is 30.13 kg/m.   see admit note MSE    COGNITIVE FEATURES THAT CONTRIBUTE TO RISK:  Closed-mindedness and Loss of executive function    SUICIDE RISK:   Moderate:  Frequent  suicidal ideation with limited intensity, and duration, some specificity in terms of plans, no associated intent, good self-control, limited dysphoria/symptomatology, some risk factors present, and identifiable protective factors, including available and accessible social support.  PLAN OF CARE: Patient will be admitted to inpatient psychiatric unit for stabilization and safety. Will provide and encourage milieu participation. Provide medication management and maked adjustments as needed.  Will follow daily.    I certify that inpatient services furnished can reasonably be expected to improve the patient's condition.   Jenne Campus, MD 04/26/2017, 5:07 PM

## 2017-04-26 NOTE — Progress Notes (Signed)
D: Patient continues to complain of anxiety.  He feels depressed, however, is interacting well with his peers.  He is participating in activities.  He reports passive suicidal thoughts without a specific plan.  Patient has needed his ativan every 6 hours and started on buspar and lexapro.  Patient is pleasant with staff.  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  He rates his depression and anxiety as an 8; hopelessness as a 7.  Patient has a supportive family. A: Continue to monitor medication management and MD orders.  Safety checks completed every 15 minutes per protocol.  Offer support and encouragement as needed. R: Patient is receptive to staff; his behavior is appropriate.

## 2017-04-26 NOTE — Progress Notes (Signed)
The patient spoke at great length in group last evening and monopolized the conversation. Rather than focusing on today, he spoke at length about all of the things that led up to his admission at this hospital. He admits to having been depressed and for having a variety of medical issues. The patient's goal for tomorrow is to speak with his doctor.

## 2017-04-26 NOTE — BHH Group Notes (Signed)
LCSW Group Therapy Note  04/26/2017 1:15pm  Type of Therapy/Topic:  Group Therapy:  Feelings about Diagnosis  Participation Level: Active  Description of Group:   This group will allow patients to explore their thoughts and feelings about diagnoses they have received. Patients will be guided to explore their level of understanding and acceptance of these diagnoses. Facilitator will encourage patients to process their thoughts and feelings about the reactions of others to their diagnosis and will guide patients in identifying ways to discuss their diagnosis with significant others in their lives. This group will be process-oriented, with patients participating in exploration of their own experiences, giving and receiving support, and processing challenge from other group members.   Therapeutic Goals: 1. Patient will demonstrate understanding of diagnosis as evidenced by identifying two or more symptoms of the disorder 2. Patient will be able to express two feelings regarding the diagnosis 3. Patient will demonstrate their ability to communicate their needs through discussion and/or role play  Therapeutic Modalities:   Cognitive Behavioral Therapy Brief Therapy Feelings Identification    Georga Kaufmann, MSW, [facility] 04/26/2017 3:38 PM

## 2017-04-27 DIAGNOSIS — Z87891 Personal history of nicotine dependence: Secondary | ICD-10-CM

## 2017-04-27 DIAGNOSIS — G4700 Insomnia, unspecified: Secondary | ICD-10-CM

## 2017-04-27 DIAGNOSIS — R450 Nervousness: Secondary | ICD-10-CM

## 2017-04-27 MED ORDER — HYDROXYZINE HCL 50 MG PO TABS
50.0000 mg | ORAL_TABLET | Freq: Three times a day (TID) | ORAL | Status: DC | PRN
  Administered 2017-04-28 – 2017-04-29 (×2): 50 mg via ORAL
  Filled 2017-04-27 (×2): qty 1

## 2017-04-27 NOTE — Progress Notes (Signed)
BHH MD Progress Note  04/27/2017 1:51 PM Delmus K Kost  MRN:  [id number]   Subjective:  Patient reports that he was feeling a little anxious today after group. So he came to his room to relax for a while. He states that the medications are working well, but would like a PRN medication that isn't controlled. He was agreeable to Vistaril. He denies any SI/HI/AVH and contracts for safety.    Objective: Patient's chart and findings reviewed and discussed with treatment team. Patient is pleasant and cooperative. He has been using coping skills for when feeling anxious. Will prescribe Vistaril for anxiety since patient would like a PRN that is not a controlled substance.   Principal Problem: MDD (major depressive disorder), recurrent episode, severe (HCC) Diagnosis:   Patient Active Problem List   Diagnosis Date Noted  . MDD (major depressive disorder), recurrent episode, severe (HCC) [F33.2] 04/25/2017  . GERD (gastroesophageal reflux disease) [K21.9] 08/22/2014  . H/O left inguinal hernia repair [Z98.890, Z87.19] 08/22/2014  . Hyperlipidemia [E78.5] 09/14/2013  . Bipolar 2 disorder (HCC) [F31.81] 12/22/2011  . Obesity [E66.9] 12/22/2011  . CAD (coronary artery disease) [I25.10] 12/22/2011   Total Time spent with patient: 25 minutes  Past Psychiatric History: See H&P  Past Medical History:  Past Medical History:  Diagnosis Date  . Anxiety   . Coronary artery disease   . Depression   . Hyperlipidemia     Past Surgical History:  Procedure Laterality Date  . CORONARY ANGIOPLASTY WITH STENT PLACEMENT    . HERNIA REPAIR    . NM MYOVIEW LTD  2013   Family History:  Family History  Problem Relation Age of Onset  . Coronary artery disease Father 46  . Cancer Father   . Heart disease Father   . Hyperlipidemia Father   . Hypertension Father    Family Psychiatric  History: See H&P Social History:  Social History   Substance and Sexual Activity  Alcohol Use No     Social History    Substance and Sexual Activity  Drug Use No    Social History   Socioeconomic History  . Marital status: Married    Spouse name: None  . Number of children: None  . Years of education: None  . Highest education level: None  Social Needs  . Financial resource strain: None  . Food insecurity - worry: None  . Food insecurity - inability: None  . Transportation needs - medical: None  . Transportation needs - non-medical: None  Occupational History  . Occupation: Registered Nurse  Tobacco Use  . Smoking status: Former Smoker    Packs/day: 1.00    Years: 18.00    Pack years: 18.00    Last attempt to quit: 08/13/2011    Years since quitting: 5.7  . Smokeless tobacco: Never Used  Substance and Sexual Activity  . Alcohol use: No  . Drug use: No  . Sexual activity: Yes  Other Topics Concern  . None  Social History Narrative   Married. Education: College.   Additional Social History:    Pain Medications: see PTA meds Prescriptions: see PTA meds Over the Counter: see PTA meds History of alcohol / drug use?: No history of alcohol / drug abuse                    Sleep: Good  Appetite:  Good  Current Medications: Current Facility-Administered Medications  Medication Dose Route Frequency Provider Last Rate Last Dose  . acetaminophen (  TYLENOL) tablet 650 mg  650 mg Oral Q6H PRN Niel Hummer, NP   650 mg at 04/27/17 0867  . alum & mag hydroxide-simeth (MAALOX/MYLANTA) 200-200-20 MG/5ML suspension 30 mL  30 mL Oral Q4H PRN Elmarie Shiley A, NP      . busPIRone (BUSPAR) tablet 5 mg  5 mg Oral TID Nivek Powley, Myer Peer, MD   5 mg at 04/27/17 1206  . citalopram (CELEXA) tablet 10 mg  10 mg Oral Daily Hurschel Paynter, Myer Peer, MD   10 mg at 04/27/17 6195  . hydrOXYzine (ATARAX/VISTARIL) tablet 50 mg  50 mg Oral TID PRN Money, Lowry Ram, FNP      . LORazepam (ATIVAN) tablet 1 mg  1 mg Oral Q6H PRN Elmarie Shiley A, NP   1 mg at 04/26/17 1500  . magnesium hydroxide (MILK OF MAGNESIA)  suspension 30 mL  30 mL Oral Daily PRN Niel Hummer, NP      . traZODone (DESYREL) tablet 50 mg  50 mg Oral QHS PRN Niel Hummer, NP   50 mg at 04/26/17 2227    Lab Results:  Results for orders placed or performed during the hospital encounter of 04/25/17 (from the past 48 hour(s))  Urine rapid drug screen (hosp performed)not at [facility]     Status: None   Collection Time: 04/25/17  6:12 PM  Result Value Ref Range   Opiates NONE DETECTED NONE DETECTED   Cocaine NONE DETECTED NONE DETECTED   Benzodiazepines NONE DETECTED NONE DETECTED   Amphetamines NONE DETECTED NONE DETECTED   Tetrahydrocannabinol NONE DETECTED NONE DETECTED   Barbiturates NONE DETECTED NONE DETECTED    Comment:        DRUG SCREEN FOR MEDICAL PURPOSES ONLY.  IF CONFIRMATION IS NEEDED FOR ANY PURPOSE, NOTIFY LAB WITHIN 5 DAYS.        LOWEST DETECTABLE LIMITS FOR URINE DRUG SCREEN Drug Class       Cutoff (ng/mL) Amphetamine      1000 Barbiturate      200 Benzodiazepine   093 Tricyclics       267 Opiates          300 Cocaine          300 THC              50 Performed at [facility], [place] [address] [place], [place] [address]   CBC     Status: None   Collection Time: 04/26/17  6:11 AM  Result Value Ref Range   WBC 6.2 4.0 - 10.5 K/uL   RBC 4.66 4.22 - 5.81 MIL/uL   Hemoglobin 13.9 13.0 - 17.0 g/dL   HCT 41.9 39.0 - 52.0 %   MCV 89.9 78.0 - 100.0 fL   MCH 29.8 26.0 - 34.0 pg   MCHC 33.2 30.0 - 36.0 g/dL   RDW 13.5 11.5 - 15.5 %   Platelets 258 150 - 400 K/uL    Comment: Performed at [facility], [place] [address] [place], [place] [address]  Comprehensive metabolic panel     Status: Abnormal   Collection Time: 04/26/17  6:11 AM  Result Value Ref Range   Sodium 139 135 - 145 mmol/L   Potassium 4.0 3.5 - 5.1 mmol/L   Chloride 112 (H) 101 - 111 mmol/L   CO2 22 22 - 32 mmol/L   Glucose, Bld 104 (H) 65 - 99 mg/dL   BUN 18 6 - 20 mg/dL   Creatinine, Ser 0.86  0.61 -  1.24 mg/dL   Calcium 8.7 (L) 8.9 - 10.3 mg/dL   Total Protein 6.3 (L) 6.5 - 8.1 g/dL   Albumin 3.8 3.5 - 5.0 g/dL   AST 26 15 - 41 U/L   ALT 29 17 - 63 U/L   Alkaline Phosphatase 65 38 - 126 U/L   Total Bilirubin 0.7 0.3 - 1.2 mg/dL   GFR calc non Af Amer >60 >60 mL/min   GFR calc Af Amer >60 >60 mL/min    Comment: (NOTE) The eGFR has been calculated using the CKD EPI equation. This calculation has not been validated in all clinical situations. eGFR's persistently <60 mL/min signify possible Chronic Kidney Disease.    Anion gap 5 5 - 15    Comment: Performed at [facility], [place] [address] [place], [place] [address]  TSH     Status: None   Collection Time: 04/26/17  6:11 AM  Result Value Ref Range   TSH 2.408 0.350 - 4.500 uIU/mL    Comment: Performed by a 3rd Generation assay with a functional sensitivity of <=0.01 uIU/mL. Performed at [facility], [place] [address] [place], [place] [address]     Blood Alcohol level:  No results found for: [facility]  Metabolic Disorder Labs:  No results found for: PROLACTIN Lab Results  Component Value Date   CHOL 186 02/26/2016   TRIG 250 (H) 02/26/2016   HDL 42 02/26/2016   CHOLHDL 4.4 02/26/2016   VLDL 50 (H) 02/26/2016   LDLCALC 94 02/26/2016   LDLCALC 53 08/22/2014    Physical Findings: AIMS: Facial and Oral Movements Muscles of Facial Expression: None, normal Lips and Perioral Area: None, normal Jaw: None, normal Tongue: None, normal,Extremity Movements Upper (arms, wrists, hands, fingers): None, normal Lower (legs, knees, ankles, toes): None, normal, Trunk Movements Neck, shoulders, hips: None, normal, Overall Severity Severity of abnormal movements (highest score from questions above): None, normal Incapacitation due to abnormal movements: None, normal Patient's awareness of abnormal movements (rate only patient's report): No Awareness, Dental Status Current problems with teeth and/or  dentures?: No Does patient usually wear dentures?: No  CIWA:    COWS:     Musculoskeletal: Strength & Muscle Tone: within normal limits Gait & Station: normal Patient leans: N/A  Psychiatric Specialty Exam: Physical Exam  Nursing note and vitals reviewed. Constitutional: He is oriented to person, place, and time. He appears well-developed and well-nourished.  Cardiovascular: Normal rate.  Respiratory: Effort normal.  Musculoskeletal: Normal range of motion.  Neurological: He is alert and oriented to person, place, and time.  Skin: Skin is warm.    Review of Systems  Constitutional: Negative.   HENT: Negative.   Eyes: Negative.   Respiratory: Negative.   Cardiovascular: Negative.   Genitourinary: Negative.   Skin: Negative.   Neurological: Negative.   Endo/Heme/Allergies: Negative.   Psychiatric/Behavioral: The patient is nervous/anxious.     Blood pressure 132/72, pulse 74, temperature 97.8 F (36.6 C), temperature source Oral, resp. rate 18, height 5' 9" (1.753 m), weight 92.5 kg (204 lb), SpO2 99 %.Body mass index is 30.13 kg/m.  General Appearance: Casual  Eye Contact:  Good  Speech:  Clear and Coherent and Normal Rate  Volume:  Normal  Mood:  Anxious  Affect:  Congruent  Thought Process:  Goal Directed and Descriptions of Associations: Intact  Orientation:  Full (Time, Place, and Person)  Thought Content:  WDL  Suicidal Thoughts:  No  Homicidal Thoughts:  No  Memory:  Immediate;   Good Recent;   Good Remote;   Good  Judgement:  Good  Insight:  Good  Psychomotor Activity:  Normal  Concentration:  Concentration: Good and Attention Span: Good  Recall:  Good  Fund of Knowledge:  Good  Language:  Good  Akathisia:  No  Handed:  Right  AIMS (if indicated):     Assets:  Communication Skills Desire for Improvement Financial Resources/Insurance Housing Physical Health Social Support Transportation  ADL's:  Intact  Cognition:  WNL  Sleep:  Number of Hours:  6.5   Problems Addressed: MDD severe  Treatment Plan Summary: Daily contact with patient to assess and evaluate symptoms and progress in treatment, Medication management and Plan is to:  -Continue Buspar 5 mg PO TID for anxiety -Continue Celexa 10 mg PO Daily for mood stability -Start Vistaril 50 mg PO TID PRN for anxiety -Continue Trazodone 50 mg PO QHS PRN for insomnia -Discontinue Ativan -Encourage group therapy participation   Travis B Money, FNP 04/27/2017, 1:51 PM   Agree with NP Progress Note  

## 2017-04-27 NOTE — Progress Notes (Signed)
Patient ID: Maurice Duffy, male   DOB: [birth year], 66 y.o.   MRN: [id number]  Pt currently presents with a flat affect and anxious behavior. Pt reports to writer that their goal is to "get better." Pt states "I have had a lot of anxiety today on and off. At one point I was like "Give me an Ativan, Give me an Ativan!" Pt now states that he has Vistaril as needed but will attempt to use coping skills before taking this medication. Pt reports good sleep with current medication regimen.   Pt provided with medications per providers orders. Pt's labs and vitals were monitored throughout the night. Pt given a 1:1 about emotional and mental status. Pt supported and encouraged to express concerns and questions. Pt educated on medications and healthy sleep hygiene habits.   Pt's safety ensured with 15 minute and environmental checks. Pt currently denies SI/HI and A/V hallucinations. Pt verbally agrees to seek staff if SI/HI or A/VH occurs and to consult with staff before acting on any harmful thoughts. Will continue POC.

## 2017-04-27 NOTE — BHH Counselor (Signed)
Adult Comprehensive Assessment  Patient ID: Maurice Duffy, male   DOB: 03/25/1951, 66 y.o.   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 / Lack of resources (include bankruptcy): Some financial stressors Housing / Lack of housing: None Reported Physical health (include injuries & life threatening diseases): None Reported Social relationships: None Reported Substance abuse: None Reported Bereavement / Loss: None Reported  Living/Environment/Situation:  Living Arrangements: Spouse/significant other, Children Living conditions (as described by patient or guardian): good How long has patient lived in current situation?: 4-5 years, moved from [place] What is atmosphere in current home: Comfortable, Supportive, Loving  Family History:  Marital status: Married Number of Years Married: 65 What types of issues is patient dealing with in the relationship?: wife is a Marine scientist and has been very supportive Are you sexually active?: Yes What is your sexual orientation?: straight Has your sexual activity been affected by drugs, alcohol, medication, or emotional stress?: we've been abstaining because of wife's rheumatoid arthritis-she's been going downhill pyhsically Does patient have children?: Yes How many children?: 1 How is patient's relationship with their children?: 77  wonderful relationship-she's married  "She says I try to live in the past with her too much"  Childhood History:  By whom was/is the patient raised?: Both parents Description of patient's relationship with caregiver when they were a child: good-little dicey with father-"he was a black and white thinker" Patient's description of current relationship with people who raised him/her: both paased-daddy was sick-heart attack, mommy died  29 years ago Does patient have  siblings?: Yes Number of Siblings: 2 Description of patient's current relationship with siblings: good relationship with younger sister Did patient suffer any verbal/emotional/physical/sexual abuse as a child?: No Did patient suffer from severe childhood neglect?: No Has patient ever been sexually abused/assaulted/raped as an adolescent or adult?: No Was the patient ever a victim of a crime or a disaster?: No Witnessed domestic violence?: No Has patient been effected by domestic violence as an adult?: No  Education:  Highest grade of school patient has completed: got a 2 years nursing degree Currently a student?: No Learning disability?: No  Employment/Work Situation:   Employment situation: Retired Archivist job has been impacted by current illness: No What is the longest time patient has a held a job?: been retired 2 years-working part time at [employer]- prior to that 30 years 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 Has patient ever been in the [employer]?: No Are There Guns or Other Weapons in [place]?: No  Financial Resources:   Financial resources: Income from employment, Income from spouse, Receives SSI Does patient have a representative payee or guardian?: No  Alcohol/Substance Abuse:   What has been your use of drugs/alcohol within the last 12 months?: Drink no alcohol, use no drugs If attempted suicide, did drugs/alcohol play a role in this?: No  Social Support System:   Heritage manager System: Manufacturing engineer System: church family, family Type of faith/religion: Chrsitian How does patient's faith help to cope with current illness?: "If you know Jesus Christ as your Writer, you are going to heaven."-also leads me to make good decsions  Leisure/Recreation:   Leisure and Hobbies: like to go to gym-24 hr fitness  Strengths/Needs:   What things does the patient do well?: workng  out In what areas  does patient struggle / problems for patient: discouragement and depression, tend to be too perfectionistic  Discharge Plan:   Does patient have access to transportation?: Yes Will patient be returning to same living situation after discharge?: Yes Currently receiving community mental health services: No If no, would patient like referral for services when discharged?: Yes (What county?)([place] and [facility] program Does patient have financial barriers related to discharge medications?: No  Summary/Recommendations:   Summary and Recommendations (to be completed by the evaluator): Maurice Duffy is a 66 year old Caucasian male diagnosed wth MDD (major depressive disorder), recurrent episode, severe, without psychosis.  He presents with increased anxiety and depression symptoms, culminating in SI.  At discharge, Maurice Duffy will follow up at the [facility] program while living at home with his wife.  While here, Maurice Duffy can benefit from crises stabilization, medication management, therapeutic milieu and referral for services.  Trish Mage. 04/28/2087

## 2017-04-27 NOTE — BHH Group Notes (Signed)
[facility] Mental Health Association Group Therapy 04/27/2017 1:15pm  Type of Therapy: Mental Health Association Presentation  Participation Level: Active  Participation Quality: Attentive  Affect: Appropriate  Cognitive: Oriented  Insight: Developing/Improving  Engagement in Therapy: Engaged  Modes of Intervention: Discussion, Education and Socialization  Summary of Progress/Problems: Mental Health Association ([place]) Speaker came to talk about his personal journey with living with a mental health diagnosis. The pt processed ways by which to relate to the speaker. [place] speaker provided handouts and educational information pertaining to groups and services offered by the [facility]. Pt was engaged in speaker's presentation and was receptive to resources provided.    Georga Kaufmann, MSW, LCSWA 04/27/2017 3:33 PM

## 2017-04-27 NOTE — BHH Suicide Risk Assessment (Signed)
[place] INPATIENT:  Family/Significant Other Suicide Prevention Education  Suicide Prevention Education:  Education Completed; Maurice Duffy, daughter, [phone] has been identified by the patient as the family member/significant other with whom the patient will be residing, and identified as the person(s) who will aid the patient in the event of a mental health crisis (suicidal ideations/suicide attempt).  With written consent from the patient, the family member/significant other has been provided the following suicide prevention education, prior to the and/or following the discharge of the patient.  The suicide prevention education provided includes the following:  Suicide risk factors  Suicide prevention and interventions  National Suicide Hotline telephone number  [facility] assessment telephone number  [facility] Emergency Assistance [place] and/or Residential Mobile Crisis Unit telephone number  Request made of family/significant other to:  Remove weapons (e.g., guns, rifles, knives), all items previously/currently identified as safety concern.    Remove drugs/medications (over-the-counter, prescriptions, illicit drugs), all items previously/currently identified as a safety concern.  The family member/significant other verbalizes understanding of the suicide prevention education information provided.  The family member/significant other agrees to remove the items of safety concern listed above. Daughter stated she has been aware of her father's increasing symptoms, and that he verbalized his own level of despair just previous to his hospitalization.  She was the one who then brought him to the hospital.  Trish Mage 04/27/2017, 3:39 PM

## 2017-04-27 NOTE — Progress Notes (Signed)
Recreation Therapy Notes  Date:  04/27/17 Time: 0930 Location: [address] Dayroom  Group Topic: Stress Management  Goal Area(s) Addresses:  Patient will verbalize importance of using healthy stress management.  Patient will identify positive emotions associated with healthy stress management.   Intervention: Stress Management  Activity :  Guided Imagery.  LRT introduced the stress management technique of guided imagery.  LRT read a script to help patients envision their safe and peaceful place.  Patients were to follow along as LRT read script.  Education:  Stress Management, Discharge Planning.   Education Outcome: Acknowledges edcuation/In group clarification offered/Needs additional education  Clinical Observations/Feedback: Pt did not attend group.    Victorino Sparrow, LRT/CTRS         Victorino Sparrow A 04/27/2017 12:44 PM

## 2017-04-27 NOTE — Tx Team (Signed)
Interdisciplinary Treatment and Diagnostic Plan Update 04/27/2017 Time of Session: 9:30am  Maurice Duffy  MRN: [id number]  Principal Diagnosis:  MDD, no psychotic features     Secondary Diagnoses: Active Problems:   MDD (major depressive disorder), recurrent episode, severe (HCC)   Current Medications:  Current Facility-Administered Medications  Medication Dose Route Frequency Provider Last Rate Last Dose  . acetaminophen (TYLENOL) tablet 650 mg  650 mg Oral Q6H PRN Niel Hummer, NP   650 mg at 04/27/17 2725  . alum & mag hydroxide-simeth (MAALOX/MYLANTA) 200-200-20 MG/5ML suspension 30 mL  30 mL Oral Q4H PRN Elmarie Shiley A, NP      . busPIRone (BUSPAR) tablet 5 mg  5 mg Oral TID Cobos, Myer Peer, MD   5 mg at 04/27/17 3664  . citalopram (CELEXA) tablet 10 mg  10 mg Oral Daily Cobos, Myer Peer, MD   10 mg at 04/27/17 0823  . LORazepam (ATIVAN) tablet 1 mg  1 mg Oral Q6H PRN Elmarie Shiley A, NP   1 mg at 04/26/17 1500  . magnesium hydroxide (MILK OF MAGNESIA) suspension 30 mL  30 mL Oral Daily PRN Niel Hummer, NP      . traZODone (DESYREL) tablet 50 mg  50 mg Oral QHS PRN Niel Hummer, NP   50 mg at 04/26/17 2227    PTA Medications: Medications Prior to Admission  Medication Sig Dispense Refill Last Dose  . aspirin 325 MG tablet Take 325 mg daily by mouth.   04/24/2017  . busPIRone (BUSPAR) 15 MG tablet Take 1 tablet (15 mg total) by mouth 2 (two) times daily. (Patient taking differently: Take 7.5 mg 2 (two) times daily by mouth. ) 60 tablet 1 04/25/2017  . citalopram (CELEXA) 20 MG tablet Take 1 tablet (20 mg total) by mouth daily. (Patient taking differently: Take 20 mg at bedtime by mouth. ) 30 tablet 1 04/24/2017  . azelastine (OPTIVAR) 0.05 % ophthalmic solution Place 1 drop into both eyes 2 (two) times daily. (Patient not taking: Reported on 04/26/2017) 6 mL 12 Not Taking at Unknown time  . clopidogrel (PLAVIX) 75 MG tablet Take 1 tablet (75 mg total) by mouth daily.  (Patient not taking: Reported on 04/26/2017) 90 tablet 0 Not Taking at Unknown time  . simvastatin (ZOCOR) 20 MG tablet Take 1 tablet (20 mg total) by mouth every evening. (Patient not taking: Reported on 04/26/2017) 90 tablet 0 Not Taking at Unknown time    Treatment Modalities: Medication Management, Group therapy, Case management,  1 to 1 session with clinician, Psychoeducation, Recreational therapy.  Patient Stressors: Financial difficulties Health problems Patient Strengths: Average or above average intelligence Communication skills Supportive family/friends  Physician Treatment Plan for Primary Diagnosis:  MDD, no psychotic features    Long Term Goal(s): Improvement in symptoms so as ready for discharge Short Term Goals: Ability to verbalize feelings will improve Ability to disclose and discuss suicidal ideas Ability to demonstrate self-control will improve Ability to identify and develop effective coping behaviors will improve Ability to maintain clinical measurements within normal limits will improve Ability to identify changes in lifestyle to reduce recurrence of condition will improve Ability to identify triggers associated with substance abuse/mental health issues will improve  Medication Management: Evaluate patient's response, side effects, and tolerance of medication regimen.  Therapeutic Interventions: 1 to 1 sessions, Unit Group sessions and Medication administration.  Evaluation of Outcomes: Progressing  Physician Treatment Plan for Secondary Diagnosis: Active Problems:   MDD (major depressive disorder),  recurrent episode, severe ([place])  Long Term Goal(s): Improvement in symptoms so as ready for discharge  Short Term Goals: Ability to verbalize feelings will improve Ability to disclose and discuss suicidal ideas Ability to demonstrate self-control will improve Ability to identify and develop effective coping behaviors will improve Ability to maintain clinical  measurements within normal limits will improve Ability to identify changes in lifestyle to reduce recurrence of condition will improve Ability to identify triggers associated with substance abuse/mental health issues will improve  Medication Management: Evaluate patient's response, side effects, and tolerance of medication regimen.  Therapeutic Interventions: 1 to 1 sessions, Unit Group sessions and Medication administration.  Evaluation of Outcomes: Progressing  RN Treatment Plan for Primary Diagnosis:  MDD, no psychotic features    Long Term Goal(s): Knowledge of disease and therapeutic regimen to maintain health will improve  Short Term Goals: Compliance with prescribed medications will improve  Medication Management: RN will administer medications as ordered by provider, will assess and evaluate patient's response and provide education to patient for prescribed medication. RN will report any adverse and/or side effects to prescribing provider.  Therapeutic Interventions: 1 on 1 counseling sessions, Psychoeducation, Medication administration, Evaluate responses to treatment, Monitor vital signs and CBGs as ordered, Perform/monitor CIWA, COWS, AIMS and Fall Risk screenings as ordered, Perform wound care treatments as ordered.  Evaluation of Outcomes: Progressing  LCSW Treatment Plan for Primary Diagnosis:  MDD, no psychotic features    Long Term Goal(s): Safe transition to appropriate next level of care at discharge, Engage patient in therapeutic group addressing interpersonal concerns. Short Term Goals: Engage patient in aftercare planning with referrals and resources, Increase emotional regulation, Identify triggers associated with mental health/substance abuse issues and Increase skills for wellness and recovery  Therapeutic Interventions: Assess for all discharge needs, 1 to 1 time with Social worker, Explore available resources and support systems, Assess for adequacy in community  support network, Educate family and significant other(s) on suicide prevention, Complete Psychosocial Assessment, Interpersonal group therapy.  Evaluation of Outcomes: Progressing  Progress in Treatment: Attending groups: Yes Participating in groups: Yes Taking medication as prescribed: Yes, MD continues to assess for medication changes as needed Toleration medication: Yes, no side effects reported at this time Family/Significant other contact made: No, CSW assessing for appropriate contact Patient understands diagnosis: Developing insight  Discussing patient identified problems/goals with staff: Yes Medical problems stabilized or resolved: Yes Denies suicidal/homicidal ideation: Yes  Issues/concerns per patient self-inventory: None Other: N/A  New problem(s) identified: None identified at this time.   New Short Term/Long Term Goal(s): None identified at this time.   Discharge Plan or Barriers: Pt will return home and follow up outpatient with [facility]  Reason for Continuation of Hospitalization:  Anxiety  Depression Medication stabilization Suicidal ideation  Estimated Length of Stay: 1-3 days; Estimated discharge date 04/30/17  Attendees: Patient: 04/27/2017 11:43 AM  Physician: Dr. Parke Poisson 04/27/2017 11:43 AM  Nursing: Trinna Post RN; Chong Sicilian, RN 04/27/2017 11:43 AM  RN Care Manager: Lars Pinks, RN 04/27/2017 11:43 AM  Social Worker: Matthew Saras, [place] 04/27/2017 11:43 AM  Recreational Therapist:  04/27/2017 11:43 AM  Other: Lindell Spar, NP 04/27/2017 11:43 AM  Other:  04/27/2017 11:43 AM  Other: 04/27/2017 11:43 AM  Scribe for Treatment Team: Georga Kaufmann, MSW,LCSWA 04/27/2017 11:43 AM

## 2017-04-27 NOTE — Progress Notes (Signed)
D   Pt in the dayroom most of the evening interacting with peers   He appears more relaxed than yesterday and his affect is not as flat   He reports the ativan seems to be helping him   Reports having a good visit with his wife A   Verbal support given   Medications administered and effectiveness monitored   Q 15 min checks  R   Pt is safe presently

## 2017-04-27 NOTE — Progress Notes (Signed)
Patient ID: Maurice Duffy, male   DOB: [birth year], 66 y.o.   MRN: [id number]  DAR: Pt. Denies SI/HI and A/V Hallucinations this morning. He reports his sleep last night was good, his appetite is good, his energy level is normal, and his concentration is good. He rates his depression, hopelessness, and anxiety levels all at 3/10. Patient reports a back spasm this morning and received Tylenol which provided some relief. Support and encouragement provided to the patient. Scheduled medication administered to patient per physician's orders. Patient is receptive and cooperative. He is seen in the milieu this morning interacting with his peers and staff appropriately. His speech is rapid however writer is able to understand what he is saying. He reports that he would like to try scheduled Buspar this morning before asking for Ativan. When writer checked back in with patient a little later patient stated, "I feel great!" He started dancing and smiling. Q15 minute checks are maintained for safety.

## 2017-04-28 MED ORDER — BUSPIRONE HCL 15 MG PO TABS
15.0000 mg | ORAL_TABLET | Freq: Two times a day (BID) | ORAL | Status: DC
  Administered 2017-04-28 – 2017-04-30 (×3): 15 mg via ORAL
  Filled 2017-04-28 (×6): qty 1

## 2017-04-28 MED ORDER — BUSPIRONE HCL 15 MG PO TABS: 15.0000 mg | ORAL_TABLET | Freq: Two times a day (BID) | ORAL | Status: DC

## 2017-04-28 MED ORDER — CITALOPRAM HYDROBROMIDE 20 MG PO TABS
20.0000 mg | ORAL_TABLET | Freq: Every day | ORAL | Status: DC
  Administered 2017-04-29 – 2017-05-02 (×4): 20 mg via ORAL
  Filled 2017-04-28 (×6): qty 1

## 2017-04-28 NOTE — Progress Notes (Signed)
D: Pt presents with a flat affect and anxious mood. Pt reported poor sleep last night due to increased anxiety level. Pt verbalized to writer that he was taking Ativan for his anxiety but it was taken away yesterday. Pt verbalized to writer that he's hopeful that the Vistaril will relieve his anxiety. After taking Vistaril this am pt stated that it was effective and that he felt a little sleepy but was able to tolerate it well. Pt denies depression today. Pt denies SI. A: Medications administered as ordered per MD. Verbal support provided. Pt encouraged to attend groups. 15 minute checks performed for safety.  R: "I'm focused on stabilizing my anxiety. I'm always anxious". Pt compliant with tx.

## 2017-04-28 NOTE — Progress Notes (Signed)
[facility] MD Progress Note  04/28/2017 11:09 AM KAY RICCIUTI  MRN:  [id number]   Subjective: patient reports partially improved mood. Continues to report anxiety, but states that he feels medication is helping to decrease it. He found Ativan PRNs to be effective, but is aware of addictive potential, so has expressed interest in trying Vistaril PRNs for anxiety . Denies medication side effects.  Objective:  I have discussed case with treatment team and have met with patient. Patient presents with improving mood and a fuller range of affect. Presents less anxious and with an overall more relaxed demeanor today. Does report ongoing anxiety, some depression, but acknowledges improvement. Denies medication side effects. On Lexapro and Buspar trial at this time.  He has been visible on unit, going to groups . No disruptive or agitated behaviors   Principal Problem: MDD (major depressive disorder), recurrent episode, severe ([place]) Diagnosis:   Patient Active Problem List   Diagnosis Date Noted  . MDD (major depressive disorder), recurrent episode, severe ([place]) [F33.2] 04/25/2017  . GERD (gastroesophageal reflux disease) [K21.9] 08/22/2014  . H/O left inguinal hernia repair [W23.762, Z87.19] 08/22/2014  . Hyperlipidemia [E78.5] 09/14/2013  . Bipolar 2 disorder ([place]) [F31.81] 12/22/2011  . Obesity [E66.9] 12/22/2011  . CAD (coronary artery disease) [I25.10] 12/22/2011   Total Time spent with patient: 20 minutes  Past Psychiatric History: See H&P  Past Medical History:  Past Medical History:  Diagnosis Date  . Anxiety   . Coronary artery disease   . Depression   . Hyperlipidemia     Past Surgical History:  Procedure Laterality Date  . CORONARY ANGIOPLASTY WITH STENT PLACEMENT    . HERNIA REPAIR    . NM MYOVIEW LTD  2013   Family History:  Family History  Problem Relation Age of Onset  . Coronary artery disease Father 33  . Cancer Father   . Heart disease Father   . Hyperlipidemia  Father   . Hypertension Father    Family Psychiatric  History: See H&P Social History:  Social History   Substance and Sexual Activity  Alcohol Use No     Social History   Substance and Sexual Activity  Drug Use No    Social History   Socioeconomic History  . Marital status: Married    Spouse name: None  . Number of children: None  . Years of education: None  . Highest education level: None  Social Needs  . Financial resource strain: None  . Food insecurity - worry: None  . Food insecurity - inability: None  . Transportation needs - medical: None  . Transportation needs - non-medical: None  Occupational History  . Occupation: Equities trader  Tobacco Use  . Smoking status: Former Smoker    Packs/day: 1.00    Years: 18.00    Pack years: 18.00    Last attempt to quit: 08/13/2011    Years since quitting: 5.7  . Smokeless tobacco: Never Used  Substance and Sexual Activity  . Alcohol use: No  . Drug use: No  . Sexual activity: Yes  Other Topics Concern  . None  Social History Narrative   Married. Education: [employer].   Additional Social History:    Pain Medications: see PTA meds Prescriptions: see PTA meds Over the Counter: see PTA meds History of alcohol / drug use?: No history of alcohol / drug abuse  Sleep: Good  Appetite:  Good  Current Medications: Current Facility-Administered Medications  Medication Dose Route Frequency Provider Last Rate Last  Dose  . acetaminophen (TYLENOL) tablet 650 mg  650 mg Oral Q6H PRN Niel Hummer, NP   650 mg at 04/27/17 9326  . alum & mag hydroxide-simeth (MAALOX/MYLANTA) 200-200-20 MG/5ML suspension 30 mL  30 mL Oral Q4H PRN Elmarie Shiley A, NP      . busPIRone (BUSPAR) tablet 5 mg  5 mg Oral TID Cobos, Myer Peer, MD   5 mg at 04/28/17 0800  . citalopram (CELEXA) tablet 10 mg  10 mg Oral Daily Cobos, Myer Peer, MD   10 mg at 04/28/17 0800  . hydrOXYzine (ATARAX/VISTARIL) tablet 50 mg  50 mg Oral TID PRN Money, Lowry Ram,  FNP   50 mg at 04/28/17 1057  . magnesium hydroxide (MILK OF MAGNESIA) suspension 30 mL  30 mL Oral Daily PRN Elmarie Shiley A, NP      . traZODone (DESYREL) tablet 50 mg  50 mg Oral QHS PRN Niel Hummer, NP   50 mg at 04/27/17 2228    Lab Results:  No results found for this or any previous visit (from the past 20 hour(s)).  Blood Alcohol level:  No results found for: [facility]  Metabolic Disorder Labs:  No results found for: PROLACTIN Lab Results  Component Value Date   CHOL 186 02/26/2016   TRIG 250 (H) 02/26/2016   HDL 42 02/26/2016   CHOLHDL 4.4 02/26/2016   VLDL 50 (H) 02/26/2016   LDLCALC 94 02/26/2016   LDLCALC 53 08/22/2014    Physical Findings: AIMS: Facial and Oral Movements Muscles of Facial Expression: None, normal Lips and Perioral Area: None, normal Jaw: None, normal Tongue: None, normal,Extremity Movements Upper (arms, wrists, hands, fingers): None, normal Lower (legs, knees, ankles, toes): None, normal, Trunk Movements Neck, shoulders, hips: None, normal, Overall Severity Severity of abnormal movements (highest score from questions above): None, normal Incapacitation due to abnormal movements: None, normal Patient's awareness of abnormal movements (rate only patient's report): No Awareness, Dental Status Current problems with teeth and/or dentures?: No Does patient usually wear dentures?: No  CIWA:    COWS:     Musculoskeletal: Strength & Muscle Tone: within normal limits Gait & Station: normal Patient leans: N/A  Psychiatric Specialty Exam: Physical Exam  Nursing note and vitals reviewed. Constitutional: He is oriented to person, place, and time. He appears well-developed and well-nourished.  Cardiovascular: Normal rate.  Respiratory: Effort normal.  Musculoskeletal: Normal range of motion.  Neurological: He is alert and oriented to person, place, and time.  Skin: Skin is warm.    Review of Systems  Constitutional: Negative.   HENT: Negative.    Eyes: Negative.   Respiratory: Negative.   Cardiovascular: Negative.   Genitourinary: Negative.   Skin: Negative.   Neurological: Negative.   Endo/Heme/Allergies: Negative.   Psychiatric/Behavioral: The patient is nervous/anxious.   denies chest pain, no shortness of breath, no vomiting   Blood pressure 123/73, pulse 67, temperature 98.1 F (36.7 C), temperature source Oral, resp. rate 16, height _0  (1.753 m), weight 92.5 kg (204 lb), SpO2 99 %.Body mass index is 30.13 kg/m.  General Appearance: improved grooming   Eye Contact:  Good  Speech:  Normal Rate  Volume:  Normal  Mood:  reports mood is improving compared to prior to admission  Affect:  Appropriate and less anxious, reactive   Thought Process:  Goal Directed and Descriptions of Associations: Intact  Orientation:  Full (Time, Place, and Person)  Thought Content:  no hallucinations, no delusions, less ruminative   Suicidal  Thoughts:  No- denies suicidal or self injurious ideations, denies homicidal ideations, contracts for safety on unit   Homicidal Thoughts:  No  Memory:  Immediate;   Good Recent;   Good Remote;   Good  Judgement:  Other:  improved  Insight:  improved   Psychomotor Activity:  Normal  Concentration:  Concentration: Good and Attention Span: Good  Recall:  Good  Fund of Knowledge:  Good  Language:  Good  Akathisia:  No  Handed:  Right  AIMS (if indicated):     Assets:  Communication Skills Desire for Improvement Financial Resources/Insurance Housing Physical Health Social Support Transportation  ADL's:  Intact  Cognition:  WNL  Sleep:  Number of Hours: 6   Assessment - patient is presenting with partially improved mood and range of affect. Reports anxiety as a major symptom, but presents less anxious than on admission, and feels current medications are helping . Currently on Vistaril PRNs for anxiety as needed .   Treatment Plan Summary: Treatment Plan reviewed as below today 11/15  Daily  contact with patient to assess and evaluate symptoms and progress in treatment, Medication management and Plan is to:  -Increase Buspar to 15 mg PO BID  for anxiety -Increase Celexa to 20 mg PO Daily for depression and anxiety  -Continue  Vistaril 50 mg PO TID PRN for anxiety -Continue Trazodone 50 mg PO QHS PRN for insomnia -Encourage group / milieu participation to work on Radiographer, therapeutic and symptom reduction -Treatment team working on disposition planning options    Jenne Campus, MD 04/28/2017, 11:09 AMPatient ID: [name], male   DOB: 07/23/1950, 66 y.o.   MRN: [id number]

## 2017-04-29 MED ORDER — HYDROXYZINE HCL 50 MG PO TABS
50.0000 mg | ORAL_TABLET | Freq: Four times a day (QID) | ORAL | Status: DC | PRN
  Administered 2017-04-29 – 2017-04-30 (×3): 50 mg via ORAL
  Filled 2017-04-29 (×3): qty 1

## 2017-04-29 MED ORDER — HYDROXYZINE HCL 50 MG PO TABS: 50.0000 mg | ORAL_TABLET | Freq: Two times a day (BID) | ORAL | Status: DC | PRN

## 2017-04-29 MED ORDER — HYDROXYZINE HCL 25 MG PO TABS: 25.0000 mg | ORAL_TABLET | Freq: Every day | ORAL | Status: DC

## 2017-04-29 MED ORDER — HYDROXYZINE HCL 50 MG PO TABS: 50.0000 mg | ORAL_TABLET | Freq: Every day | ORAL | Status: DC

## 2017-04-29 NOTE — Progress Notes (Signed)
Recreation Therapy Notes  Date: 04/29/17 Time: 0930 Location: 300 [address] Dayroom  Group Topic: Stress Management  Goal Area(s) Addresses:  Patient will verbalize importance of using healthy stress management.  Patient will identify positive emotions associated with healthy stress management.   Intervention: Stress Management  Activity :  Authentic Self Meditation.  LRT introduced the stress management technique of meditation.  LRT lead patients in a meditation that allowed them to explore the things that make them who they are.  Education:  Stress Management, Discharge Planning.   Education Outcome: Acknowledges edcuation/In group clarification offered/Needs additional education  Clinical Observations/Feedback: Pt did not attend group.    Victorino Sparrow, LRT/CTRS         Victorino Sparrow A 04/29/2017 12:34 PM

## 2017-04-29 NOTE — Progress Notes (Signed)
  [facility] Adult Case Management Discharge Plan :  Will you be returning to the same living situation after discharge:  Yes,  pt returning home. At discharge, do you have transportation home?: Yes,  pt has access to transportation. Do you have the ability to pay for your medications: Yes,  pt has insurance.  Release of information consent forms completed and in the chart;  Patient's signature needed at discharge.  Patient to Follow up at: Follow-up [place] Follow up on 05/16/2017.   Why:  Hospital follow-up appointment 12/3 at 10am with [name]. This was the first available appointment with your preferred provider. Contact information: [address]  [place] [address] [place] [facility] Follow up on 05/03/2017.   Specialty:  Behavioral Health Why:  Assessment appointment for the Intensive Outpatient Program 11/20 at 8:30am. Please call the office if you need to cancel or reschedule your appointment.  Contact information: [place] [id number] [place] [phone]          Next level of care provider has access to [place] and Suicide Prevention discussed: Yes,  with pt and with pt's daughter.  Have you used any form of tobacco in the last 30 days? (Cigarettes, Smokeless Tobacco, Cigars, and/or Pipes): No  Has patient been referred to the Quitline?: N/A patient is not a smoker  Patient has been referred for addiction treatment: [place], MSW, LCSWA 04/29/2017, 3:38 PM

## 2017-04-29 NOTE — B[facility] Group Notes (Signed)
LCSW Group Therapy Note  04/29/2017 1:15pm  Type of Therapy and Topic:  Group Therapy:  Feelings around Relapse and Recovery  Participation Level: Active   Description of Group:    Patients in this group will discuss emotions they experience before and after a relapse. They will process how experiencing these feelings, or avoidance of experiencing them, relates to having a relapse. Facilitator will guide patients to explore emotions they have related to recovery. Patients will be encouraged to process which emotions are more powerful. They will be guided to discuss the emotional reaction significant others in their lives may have to their relapse or recovery. Patients will be assisted in exploring ways to respond to the emotions of others without this contributing to a relapse.  Therapeutic Goals: 1. Patient will identify two or more emotions that lead to a relapse for them 2. Patient will identify two emotions that result when they relapse 3. Patient will identify two emotions related to recovery 4. Patient will demonstrate ability to communicate their needs through discussion and/or role plays    Therapeutic Modalities:   Cognitive Behavioral Therapy Solution-Focused Therapy Assertiveness Training Relapse Prevention Therapy   Offie Pickron B Bardia Wangerin, MSW, LCSWA 04/29/2017 3:42 PM   

## 2017-04-29 NOTE — Progress Notes (Signed)
[facility] MD Progress Note  04/29/2017 2:28 PM Maurice Duffy  MRN:  [id number]   Subjective: Maurice Duffy reports " I am having increased anxiety, partly due to drink two cups of coffee."  Objective:  Maurice Duffy is awake, alert and oriented. Seen actively engaged and   participating in group session on relapse prevention. Denies suicidal or homicidal ideations during this assessment. Denies auditory or visual hallucination and does not appear to be responding to internal stimuli.  Patient reports his main concern is his anxiety and states he is hopeful to start feeling better soon. " I just need to get this breakthrough anxiety under control." discussed medication adjustment to vistaril 50 mg to 25 mg BID and will continue 50 mg QHS. Patient denies depression or depressive symptoms.  Patient reports he is medication compliant without mediation side effects. Reports good appetite other wise and states he is resting well with the added vistaril at bedtime . Support, encouragement and reassurance was provided.   Principal Problem: MDD (major depressive disorder), recurrent episode, severe ([place]) Diagnosis:   Patient Active Problem List   Diagnosis Date Noted  . MDD (major depressive disorder), recurrent episode, severe ([place]) [F33.2] 04/25/2017  . GERD (gastroesophageal reflux disease) [K21.9] 08/22/2014  . H/O left inguinal hernia repair [U54.270, Z87.19] 08/22/2014  . Hyperlipidemia [E78.5] 09/14/2013  . Bipolar 2 disorder ([place]) [F31.81] 12/22/2011  . Obesity [E66.9] 12/22/2011  . CAD (coronary artery disease) [I25.10] 12/22/2011   Total Time spent with patient: 20 minutes  Past Psychiatric History: See H&P  Past Medical History:  Past Medical History:  Diagnosis Date  . Anxiety   . Coronary artery disease   . Depression   . Hyperlipidemia     Past Surgical History:  Procedure Laterality Date  . CORONARY ANGIOPLASTY WITH STENT PLACEMENT    . HERNIA REPAIR    . NM MYOVIEW LTD  2013   Family  History:  Family History  Problem Relation Age of Onset  . Coronary artery disease Father 84  . Cancer Father   . Heart disease Father   . Hyperlipidemia Father   . Hypertension Father    Family Psychiatric  History: See H&P Social History:  Social History   Substance and Sexual Activity  Alcohol Use No     Social History   Substance and Sexual Activity  Drug Use No    Social History   Socioeconomic History  . Marital status: Married    Spouse name: None  . Number of children: None  . Years of education: None  . Highest education level: None  Social Needs  . Financial resource strain: None  . Food insecurity - worry: None  . Food insecurity - inability: None  . Transportation needs - medical: None  . Transportation needs - non-medical: None  Occupational History  . Occupation: Equities trader  Tobacco Use  . Smoking status: Former Smoker    Packs/day: 1.00    Years: 18.00    Pack years: 18.00    Last attempt to quit: 08/13/2011    Years since quitting: 5.7  . Smokeless tobacco: Never Used  Substance and Sexual Activity  . Alcohol use: No  . Drug use: No  . Sexual activity: Yes  Other Topics Concern  . None  Social History Narrative   Married. Education: [employer].   Additional Social History:    Pain Medications: see PTA meds Prescriptions: see PTA meds Over the Counter: see PTA meds History of alcohol / drug use?:  No history of alcohol / drug abuse  Sleep: Good  Appetite:  Good  Current Medications: Current Facility-Administered Medications  Medication Dose Route Frequency Provider Last Rate Last Dose  . acetaminophen (TYLENOL) tablet 650 mg  650 mg Oral Q6H PRN Maurice Hummer, Maurice Duffy   650 mg at 04/27/17 5093  . alum & mag hydroxide-simeth (MAALOX/MYLANTA) 200-200-20 MG/5ML suspension 30 mL  30 mL Oral Q4H PRN Maurice Shiley Duffy, Maurice Duffy      . busPIRone (BUSPAR) tablet 15 mg  15 mg Oral BID Maurice Duffy, Maurice Peer, MD   15 mg at 04/29/17 0827  . citalopram (CELEXA)  tablet 20 mg  20 mg Oral Daily Danali Marinos, Maurice Peer, MD   20 mg at 04/29/17 0827  . hydrOXYzine (ATARAX/VISTARIL) tablet 25 mg  25 mg Oral QHS Maurice Center, Maurice Duffy       Or  . hydrOXYzine (ATARAX/VISTARIL) tablet 50 mg  50 mg Oral QHS Maurice Center, Maurice Duffy      . magnesium hydroxide (MILK OF MAGNESIA) suspension 30 mL  30 mL Oral Daily PRN Maurice Hummer, Maurice Duffy      . traZODone (DESYREL) tablet 50 mg  50 mg Oral QHS PRN Maurice Hummer, Maurice Duffy   50 mg at 04/28/17 2128    Lab Results:  No results found for this or any previous visit (from the past 48 hour(s)).  Blood Alcohol level:  No results found for: [facility] [place]  Metabolic Disorder Labs:  No results found for: PROLACTIN Lab Results  Component Value Date   CHOL 186 02/26/2016   TRIG 250 (H) 02/26/2016   HDL 42 02/26/2016   CHOLHDL 4.4 02/26/2016   VLDL 50 (H) 02/26/2016   LDLCALC 94 02/26/2016   LDLCALC 53 08/22/2014    Physical Findings: AIMS: Facial and Oral Movements Muscles of Facial Expression: None, normal Lips and Perioral Area: None, normal Jaw: None, normal Tongue: None, normal,Extremity Movements Upper (arms, wrists, hands, fingers): None, normal Lower (legs, knees, ankles, toes): None, normal, Trunk Movements Neck, shoulders, hips: None, normal, Overall Severity Severity of abnormal movements (highest score from questions above): None, normal Incapacitation due to abnormal movements: None, normal Patient's awareness of abnormal movements (rate only patient's report): No Awareness, Dental Status Current problems with teeth and/or dentures?: No Does patient usually wear dentures?: No  CIWA:    COWS:     Musculoskeletal: Strength & Muscle Tone: within normal limits Gait & Station: normal Patient leans: N/[place]uffy  Psychiatric Specialty Exam: Physical Exam  Nursing note and vitals reviewed. Constitutional: He is oriented to person, place, and time. He appears well-developed and well-nourished.  Cardiovascular: Normal rate.   Respiratory: Effort normal.  Musculoskeletal: Normal range of motion.  Neurological: He is alert and oriented to person, place, and time.  Skin: Skin is warm.    Review of Systems  Neurological: Negative.   Endo/Heme/Allergies: Negative.   Psychiatric/Behavioral: The patient is nervous/anxious.   denies chest pain, no shortness of breath, no vomiting   Blood pressure 122/80, pulse 66, temperature 98.4 F (36.9 C), temperature source Oral, resp. rate 18, height 5\' 9"  (1.753 m), weight 92.5 kg (204 lb), SpO2 99 %.Body mass index is 30.13 kg/m.  General Appearance: Casual  Eye Contact:  Good  Speech:  Normal Rate  Volume:  Normal  Mood:  Anxious and Irritable  Affect:  Appropriate  Thought Process:  Goal Directed and Descriptions of Associations: Intact  Orientation:  Full (Time, Place, and Person)  Thought Content:  Hallucinations: None  and Rumination  Suicidal Thoughts:  No  Homicidal Thoughts:  No  Memory:  Immediate;   Good Recent;   Good Remote;   Good  Judgement:  Other:  improved  Insight:  improved   Psychomotor Activity:  Normal  Concentration:  Concentration: Good and Attention Span: Good  Recall:  Good  Fund of Knowledge:  Good  Language:  Good  Akathisia:  No  Handed:  Right  AIMS (if indicated):     Assets:  Communication Skills Desire for Improvement Physical Health Social Support Transportation  ADL's:  Intact  Cognition:  WNL  Sleep:  Number of Hours: 6  .   Treatment Plan Summary: Daily contact with patient to assess and evaluate symptoms and progress in treatment and Medication management    Continue with current treatment plan on 04/29/2017 except where noted  -Continue  Buspar to 15 mg PO BID  for anxiety -Continue Celexa to 20 mg PO Daily for depression and anxiety  -Adjustment Vistaril 50 mg QHS and 25mg  BID  PRN for anxiety -Continue Trazodone 50 mg PO QHS PRN for insomnia -Encourage group / milieu participation to work on coping skills  and symptom reduction -Treatment team working on disposition planning options    Maurice Center, Maurice Duffy 04/29/2017, 2:28 PM   Agree with Maurice Duffy Progress Note

## 2017-04-29 NOTE — Plan of Care (Signed)
  Progressing Safety: Periods of time without injury will increase 04/29/2017 2237 - Progressing by Karie Kirks, RN Note Pt has not harmed self or others tonight.  He denies SI/HI and verbally contracts for safety.

## 2017-04-29 NOTE — Progress Notes (Signed)
DPatient is observed walking around the 400 hall.he paces up and down the hall with a wrried, anxious affect. HE frequesntly approaches this Probation officer, with a question regarding his buspar and / or vistaril. We discuss his worry and then he daparts...only to re approach wirter with similar concern...but worded differenlty. A HE completed hisd aily assessment and on this he wrote  He deneid SI today and he rated his depression, hopelessness and anxiety " 3/1/3", respectively. HE reported to this writer this afternoon that he felt his anxiety " has gotten totally out of control because I drank that caffeinated coffe that I thought was decaffenitated " . He was stuttering. HE was unable to complete a full snetence. He stated that he had already spoken to a NP , who was " putting the order in the computer now". R Pt given 50 mg po vistaril for anxiety, per order.

## 2017-04-29 NOTE — Progress Notes (Signed)
Patient ID: Maurice Duffy, male   DOB: 08/13/1950, 66 y.o.   MRN: [id number]  Pt currently presents with a flat affect and labile behavior. Pt has poor boundaries (touching) with other patients. Reports decreased anxiety today. Pt reports to writer that their goal today was to "be okay with the Vistaril as my anxiety medications." Pt states "I have no anxiety or depression right now, I'm kind of wondering if I am even myself?!" Pt reports good sleep with current medication regimen.   Pt provided with medications per providers orders. Pt's labs and vitals were monitored throughout the night. Pt given a 1:1 about emotional and mental status. Pt supported and encouraged to express concerns and questions. Pt educated on medications.  Pt's safety ensured with 15 minute and environmental checks. Pt currently denies SI/HI and A/V hallucinations. Pt verbally agrees to seek staff if SI/HI or A/VH occurs and to consult with staff before acting on any harmful thoughts. Will continue POC.

## 2017-04-29 NOTE — Progress Notes (Signed)
D: Pt was in the hallway upon initial approach.  Pt presents with anxious affect and mood.  He reports he "had kind of a tough day."  Pt attributes this to "adjusting to the meds."  He reports having anxiety on and off.  Pt denies SI/HI, denies hallucinations, denies pain.  Pt has been visible in milieu interacting with peers and staff appropriately.  Pt attended evening group.    A: Introduced self to pt.  Actively listened to pt and offered support and encouragement.  PRN medication administered for anxiety and sleep.  Q15 minute safety checks maintained.  R: Pt is safe on the unit.  Pt is compliant with medications.  Pt verbally contracts for safety.  Will continue to monitor and assess.

## 2017-04-30 ENCOUNTER — Encounter (HOSPITAL_COMMUNITY): Payer: Self-pay | Admitting: Registered Nurse

## 2017-04-30 DIAGNOSIS — F411 Generalized anxiety disorder: Secondary | ICD-10-CM

## 2017-04-30 MED ORDER — HYDROXYZINE HCL 25 MG PO TABS
25.0000 mg | ORAL_TABLET | Freq: Three times a day (TID) | ORAL | Status: DC | PRN
  Administered 2017-04-30 – 2017-05-01 (×4): 25 mg via ORAL
  Filled 2017-04-30: qty 1

## 2017-04-30 MED ORDER — HYDROXYZINE HCL 25 MG PO TABS: 25.0000 mg | ORAL_TABLET | Freq: Four times a day (QID) | ORAL | Status: DC | PRN

## 2017-04-30 MED ORDER — BUSPIRONE HCL 15 MG PO TABS
15.0000 mg | ORAL_TABLET | Freq: Three times a day (TID) | ORAL | Status: DC
  Administered 2017-04-30 – 2017-05-02 (×8): 15 mg via ORAL
  Filled 2017-04-30 (×12): qty 1

## 2017-04-30 NOTE — Progress Notes (Signed)
Pt attend wrap up group. His day was a 2. He had trouble break thru anxiety. He had visitors to come. This change his perspective he feels 100 percent better his pastor pray with him. Now he's not anxious.

## 2017-04-30 NOTE — [facility] Group Notes (Signed)
LCSW Group Therapy Note  Date/Time:  04/30/2017   10:00AM-11:00AM  Type of Therapy and Topic:  Group Therapy:  Fears and Unhealthy/Healthy Coping Skills  Participation Level:  Active   Description of Group:  The focus of this group was to discuss some of the prevalent fears that patients experience, and to identify the commonalities among group members.  An exercise was used to initiate the discussion, followed by writing on the white board a group-generated list of unhealthy coping and healthy coping techniques to deal with each fear.    Therapeutic Goals: 1. Patient will be able to distinguish between healthy and unhealthy coping skills 2. Patient will identify and describe 3 fears they experience 3. Patient will identify one positive coping strategy for each fear they experience 4. Patient will respond empathetically to peers' statements regarding fears they experience  Summary of Patient Progress:  The patient expressed a fear of being alone since he retired and does not have things to keep him busy, almost regrets retiring.  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  Therapeutic Modalities Cognitive Behavioral Therapy Motivational Interviewing  Selmer Dominion, LCSW

## 2017-04-30 NOTE — Progress Notes (Signed)
Patient shared in group that he had a great day and that he rated his day as a 10 out of a possible 10. He further explained that he felt as if the medication is beginning to work and that he is feeling less anxious. His goal for tomorrow is to continue to take his medication to address his anxiety.

## 2017-04-30 NOTE — Progress Notes (Signed)
D: Pt was in the dayroom upon initial approach.  Pt presents with anxious, pleasant affect and mood.  He reports he had a good day and "I got the medication squared around."  Pt denies SI/HI, denies hallucinations, denies pain.  Pt has been visible in milieu interacting with peers and staff appropriately.  Pt attended evening group.    A: Introduced self to pt.  Actively listened to pt and offered support and encouragement. PRN medication administered for sleep and anxiety.  Q15 minute safety checks maintained.  R: Pt is safe on the unit.  Pt is compliant with medications.  Pt verbally contracts for safety.  Will continue to monitor and assess.

## 2017-04-30 NOTE — Progress Notes (Signed)
D. Pt presents with an anxious affect with congruent behavior upon initial approach. Pt completed his self inventory- rating his depression, hopelessness and anxiety- all 4/10. Pt also reports having slept poorly last night- endorsing having low energy and poor concentration. Pt currently denies SI/HI and AVH . Pt observed interacting with peers in the dayroom and was engaged, participating in morning nursing ed group A. Labs and vitals monitored. Pt compliant with his medications. Pt supported emotionally and encouraged to express concerns and ask questions.   R. Pt remains safe with 15 minute checks. Will continue POC.

## 2017-04-30 NOTE — Progress Notes (Signed)
[facility] MD Progress Note  04/30/2017 4:05 PM Maurice Duffy  MRN:  [id number]   Subjective:  "I'm still having some anxiety.  Took the Vistaril 50 mg this morning and it knocked me off of my rocker.  Made me a little sleepy."  Objective:  Patient seen by this provider.  Chart reviewed, and discussed with treatment team, and face to face evaluation on 04/30/17.   On evaluation:  Maurice Duffy alert/oriented x 4, calm/cooperative, and did not appear to be responding to internal/external stimuli.  Patient tolerating medications without adverse reaction, eating without difficulty, and sleep is improving.  Denies suicidal/homicidal ideation, psychosis, and paranoia.  Continues to have anxiety but feels that 50 mg of Vistaril was a little to strong because made him sleepy after taking.     Principal Problem: MDD (major depressive disorder), recurrent episode, severe ([place]) Diagnosis:   Patient Active Problem List   Diagnosis Date Noted  . MDD (major depressive disorder), recurrent episode, severe ([place]) [F33.2] 04/25/2017  . GERD (gastroesophageal reflux disease) [K21.9] 08/22/2014  . H/O left inguinal hernia repair [H85.277, Z87.19] 08/22/2014  . Hyperlipidemia [E78.5] 09/14/2013  . Bipolar 2 disorder ([place]) [F31.81] 12/22/2011  . Obesity [E66.9] 12/22/2011  . CAD (coronary artery disease) [I25.10] 12/22/2011   Total Time spent with patient: 15 minutes  Past Psychiatric History: See H&P  Past Medical History:  Past Medical History:  Diagnosis Date  . Anxiety   . Coronary artery disease   . Depression   . Hyperlipidemia     Past Surgical History:  Procedure Laterality Date  . CORONARY ANGIOPLASTY WITH STENT PLACEMENT    . HERNIA REPAIR    . NM MYOVIEW LTD  2013   Family History:  Family History  Problem Relation Age of Onset  . Coronary artery disease Father 4  . Cancer Father   . Heart disease Father   . Hyperlipidemia Father   . Hypertension Father    Family Psychiatric  History:  See H&P Social History:  Social History   Substance and Sexual Activity  Alcohol Use No     Social History   Substance and Sexual Activity  Drug Use No    Social History   Socioeconomic History  . Marital status: Married    Spouse name: None  . Number of children: None  . Years of education: None  . Highest education level: None  Social Needs  . Financial resource strain: None  . Food insecurity - worry: None  . Food insecurity - inability: None  . Transportation needs - medical: None  . Transportation needs - non-medical: None  Occupational History  . Occupation: Equities trader  Tobacco Use  . Smoking status: Former Smoker    Packs/day: 1.00    Years: 18.00    Pack years: 18.00    Last attempt to quit: 08/13/2011    Years since quitting: 5.7  . Smokeless tobacco: Never Used  Substance and Sexual Activity  . Alcohol use: No  . Drug use: No  . Sexual activity: Yes  Other Topics Concern  . None  Social History Narrative   Married. Education: [employer].   Additional Social History:    Pain Medications: see PTA meds Prescriptions: see PTA meds Over the Counter: see PTA meds History of alcohol / drug use?: No history of alcohol / drug abuse  Sleep: Good  Appetite:  Good  Current Medications: Current Facility-Administered Medications  Medication Dose Route Frequency Provider Last Rate Last Dose  .  acetaminophen (TYLENOL) tablet 650 mg  650 mg Oral Q6H PRN Niel Hummer, NP   650 mg at 04/27/17 9563  . alum & mag hydroxide-simeth (MAALOX/MYLANTA) 200-200-20 MG/5ML suspension 30 mL  30 mL Oral Q4H PRN Elmarie Shiley A, NP      . busPIRone (BUSPAR) tablet 15 mg  15 mg Oral TID Rankin, Shuvon B, NP   15 mg at 04/30/17 1131  . citalopram (CELEXA) tablet 20 mg  20 mg Oral Daily Cobos, Fernando A, MD   20 mg at 04/30/17 8756  . hydrOXYzine (ATARAX/VISTARIL) tablet 25 mg  25 mg Oral TID PRN Rankin, Shuvon B, NP   25 mg at 04/30/17 1131  . magnesium hydroxide (MILK OF  MAGNESIA) suspension 30 mL  30 mL Oral Daily PRN Elmarie Shiley A, NP      . traZODone (DESYREL) tablet 50 mg  50 mg Oral QHS PRN Niel Hummer, NP   50 mg at 04/29/17 2225    Lab Results:  No results found for this or any previous visit (from the past 6 hour(s)).  Blood Alcohol level:  No results found for: [facility]  Metabolic Disorder Labs:  No results found for: PROLACTIN Lab Results  Component Value Date   CHOL 186 02/26/2016   TRIG 250 (H) 02/26/2016   HDL 42 02/26/2016   CHOLHDL 4.4 02/26/2016   VLDL 50 (H) 02/26/2016   LDLCALC 94 02/26/2016   LDLCALC 53 08/22/2014    Physical Findings: AIMS: Facial and Oral Movements Muscles of Facial Expression: None, normal Lips and Perioral Area: None, normal Jaw: None, normal Tongue: None, normal,Extremity Movements Upper (arms, wrists, hands, fingers): None, normal Lower (legs, knees, ankles, toes): None, normal, Trunk Movements Neck, shoulders, hips: None, normal, Overall Severity Severity of abnormal movements (highest score from questions above): None, normal Incapacitation due to abnormal movements: None, normal Patient's awareness of abnormal movements (rate only patient's report): No Awareness, Dental Status Current problems with teeth and/or dentures?: No Does patient usually wear dentures?: No  CIWA:    COWS:     Musculoskeletal: Strength & Muscle Tone: within normal limits Gait & Station: normal Patient leans: N/A  Psychiatric Specialty Exam: Physical Exam  Nursing note and vitals reviewed. Constitutional: He is oriented to person, place, and time. He appears well-developed and well-nourished.  Neck: Normal range of motion.  Cardiovascular: Normal rate.  Respiratory: Effort normal.  Musculoskeletal: Normal range of motion.  Neurological: He is alert and oriented to person, place, and time.  Skin: Skin is warm and dry.    Review of Systems  Neurological: Negative.   Endo/Heme/Allergies: Negative.    Psychiatric/Behavioral: Depression: Improved. Suicidal ideas: Denies at this time. The patient is nervous/anxious.   denies chest pain, no shortness of breath, no vomiting   Blood pressure 113/75, pulse 70, temperature 97.6 F (36.4 C), temperature source Oral, resp. rate 18, height 5\' 9"  (1.753 m), weight 92.5 kg (204 lb), SpO2 99 %.Body mass index is 30.13 kg/m.  General Appearance: Casual  Eye Contact:  Good  Speech:  Normal Rate  Volume:  Normal  Mood:  Anxious  Affect:  Appropriate  Thought Process:  Goal Directed and Descriptions of Associations: Intact  Orientation:  Full (Time, Place, and Person)  Thought Content:  Hallucinations: None and Rumination  Suicidal Thoughts:  No  Homicidal Thoughts:  No  Memory:  Immediate;   Good Recent;   Good Remote;   Good  Judgement:  Other:  improved  Insight:  Improved  Psychomotor Activity:  Normal  Concentration:  Concentration: Good and Attention Span: Good  Recall:  Good  Fund of Knowledge:  Good  Language:  Good  Akathisia:  No  Handed:  Right  AIMS (if indicated):     Assets:  Communication Skills Desire for Improvement Physical Health Social Support Transportation  ADL's:  Intact  Cognition:  WNL  Sleep:  Number of Hours: 6  .   Treatment Plan Summary: Daily contact with patient to assess and evaluate symptoms and progress in treatment and Medication management    Continue with current treatment plan on 04/29/2017 except where noted  -Adjustment Increased  Buspar to 15 mg PO Tid  for anxiety -Continue Celexa to 20 mg PO Daily for depression and anxiety  -Adjustment Decreased  Vistaril 25 mg Tid  PRN for anxiety -Continue Trazodone 50 mg PO QHS PRN for insomnia -Encourage group / milieu participation to work on Radiographer, therapeutic and symptom reduction -Treatment team working on disposition planning options    Rankin, Shuvon, NP 04/30/2017, 4:05 PM   Agree with NP Progress Note

## 2017-04-30 NOTE — [facility] Group Notes (Signed)
[place] Group Notes:  (Nursing)  Date:  04/30/2017  Time:1030 Type of Therapy:  Nurse Education  Participation Level:  Active  Participation Quality:  Appropriate and Attentive  Affect:  Appropriate  Cognitive:  Alert and Appropriate  Insight:  Appropriate and Good  Engagement in Group:  Engaged  Modes of Intervention:  Discussion and Education  Summary of Progress/Problems: Discussed goals and went over Healthy Coping Skills workbook  Waymond Cera 04/30/2017, 12:14 PM

## 2017-05-01 NOTE — Progress Notes (Signed)
D. Pt easily aroused from sleep for am meds. Pt reports having slept well last night- denies pain, depression and anxiety at this time. Pt completed self inventory. Pt currently denies SI/HI and AVH. A. Labs and vitals monitored. Pt compliant with medications medications. Pt supported emotionally and encouraged to express concerns and ask questions.   R. Pt remains safe with 15 minute checks. Will continue POC.

## 2017-05-01 NOTE — Progress Notes (Signed)
Pt attended evening wrap up group and stated today was a 10 for him, his meds have been working well for him and making him "feel more like myself again" he had visits from his deacon, wife, and daughter that was "needed!". He will be preparing for d/c tomorrow.

## 2017-05-01 NOTE — [facility] Group Notes (Signed)
[facility] LCSW Group Therapy Note  Date/Time:  05/01/2017 10:00-11:00AM  Type of Therapy and Topic:  Group Therapy:  Healthy and Unhealthy Supports  Participation Level:  Active   Description of Group:  Patients in this group were introduced to the idea of adding a variety of healthy supports to address the various needs in their lives. The picture on the front of Sunday's workbook was used to demonstrate why more supports are needed in every patient's life.  Patients identified and described healthy supports versus unhealthy supports in general, then gave examples of each in their own lives.   They discussed what additional healthy supports could be helpful in their recovery and wellness after discharge in order to prevent future hospitalizations.   An emphasis was placed on using counselor, doctor, therapy groups, 12-step groups, and problem-specific support groups to expand supports.  They also worked as a group on developing a specific plan for several patients to deal with unhealthy supports through [place], psychoeducation with loved ones, and even termination of relationships.   Therapeutic Goals:   1)  discuss importance of adding supports to stay well once out of the hospital  2)  compare healthy versus unhealthy supports and identify some examples of each  3)  generate ideas and descriptions of healthy supports that can be added  4)  offer mutual support about how to address unhealthy supports  5)  encourage active participation in and adherence to discharge plan    Summary of Patient Progress:  The patient shared a lot throughout group and was very supportive of others.  He specifically talked about the healthy boundaries he has had to set with his father.   Therapeutic Modalities:   Motivational Interviewing Brief Solution-Focused Therapy  Selmer Dominion, LCSW 05/01/2017, 11:00AM

## 2017-05-01 NOTE — Progress Notes (Signed)
[facility] MD Progress Note  05/01/2017 12:11 PM LAMARKUS NEBEL  MRN:  [id number]   Subjective:  Patient reports he is feeling better . Denies medication side effects.  As he improves he is focusing more on disposition planning, but states he does not yet feel ready to discharge, and expresses vague sense of apprehension about discharging, in spite of improving mood . Denies suicidal ideations. Denies medication side effects .  Objective:   I have reviewed chart notes and have met with patient. Patient presents improved compared to admission, with an improved mood and range of affect. Remains vaguely anxious . Denies suicidal ideations. States medications are well tolerated, and feels current combination - Buspar, Celexa- is effective and well tolerated . No disruptive or agitated behaviors on unit, pleasant on approach, visible in day room.    Principal Problem: MDD (major depressive disorder), recurrent episode, severe ([place]) Diagnosis:   Patient Active Problem List   Diagnosis Date Noted  . GAD (generalized anxiety disorder) [F41.1]   . MDD (major depressive disorder), recurrent episode, severe ([place]) [F33.2] 04/25/2017  . GERD (gastroesophageal reflux disease) [K21.9] 08/22/2014  . H/O left inguinal hernia repair [O84.166, Z87.19] 08/22/2014  . Hyperlipidemia [E78.5] 09/14/2013  . Bipolar 2 disorder ([place]) [F31.81] 12/22/2011  . Obesity [E66.9] 12/22/2011  . CAD (coronary artery disease) [I25.10] 12/22/2011   Total Time spent with patient: 15 minutes  Past Psychiatric History: See H&P  Past Medical History:  Past Medical History:  Diagnosis Date  . Anxiety   . Coronary artery disease   . Depression   . Hyperlipidemia     Past Surgical History:  Procedure Laterality Date  . CORONARY ANGIOPLASTY WITH STENT PLACEMENT    . HERNIA REPAIR    . NM MYOVIEW LTD  2013   Family History:  Family History  Problem Relation Age of Onset  . Coronary artery disease Father 47  . Cancer  Father   . Heart disease Father   . Hyperlipidemia Father   . Hypertension Father    Family Psychiatric  History: See H&P Social History:  Social History   Substance and Sexual Activity  Alcohol Use No     Social History   Substance and Sexual Activity  Drug Use No    Social History   Socioeconomic History  . Marital status: Married    Spouse name: None  . Number of children: None  . Years of education: None  . Highest education level: None  Social Needs  . Financial resource strain: None  . Food insecurity - worry: None  . Food insecurity - inability: None  . Transportation needs - medical: None  . Transportation needs - non-medical: None  Occupational History  . Occupation: Equities trader  Tobacco Use  . Smoking status: Former Smoker    Packs/day: 1.00    Years: 18.00    Pack years: 18.00    Last attempt to quit: 08/13/2011    Years since quitting: 5.7  . Smokeless tobacco: Never Used  Substance and Sexual Activity  . Alcohol use: No  . Drug use: No  . Sexual activity: Yes  Other Topics Concern  . None  Social History Narrative   Married. Education: [employer].   Additional Social History:    Pain Medications: see PTA meds Prescriptions: see PTA meds Over the Counter: see PTA meds History of alcohol / drug use?: No history of alcohol / drug abuse  Sleep: Good  Appetite:  Good  Current Medications: Current Facility-Administered Medications  Medication Dose Route Frequency Provider Last Rate Last Dose  . acetaminophen (TYLENOL) tablet 650 mg  650 mg Oral Q6H PRN Niel Hummer, NP   650 mg at 04/27/17 0973  . alum & mag hydroxide-simeth (MAALOX/MYLANTA) 200-200-20 MG/5ML suspension 30 mL  30 mL Oral Q4H PRN Elmarie Shiley A, NP      . busPIRone (BUSPAR) tablet 15 mg  15 mg Oral TID Rankin, Shuvon B, NP   15 mg at 05/01/17 1154  . citalopram (CELEXA) tablet 20 mg  20 mg Oral Daily Cobos, Fernando A, MD   20 mg at 05/01/17 0756  . hydrOXYzine  (ATARAX/VISTARIL) tablet 25 mg  25 mg Oral TID PRN Rankin, Shuvon B, NP   25 mg at 04/30/17 2220  . magnesium hydroxide (MILK OF MAGNESIA) suspension 30 mL  30 mL Oral Daily PRN Elmarie Shiley A, NP      . traZODone (DESYREL) tablet 50 mg  50 mg Oral QHS PRN Niel Hummer, NP   50 mg at 04/30/17 2220    Lab Results:  No results found for this or any previous visit (from the past 35 hour(s)).  Blood Alcohol level:  No results found for: [facility]  Metabolic Disorder Labs:  No results found for: PROLACTIN Lab Results  Component Value Date   CHOL 186 02/26/2016   TRIG 250 (H) 02/26/2016   HDL 42 02/26/2016   CHOLHDL 4.4 02/26/2016   VLDL 50 (H) 02/26/2016   LDLCALC 94 02/26/2016   LDLCALC 53 08/22/2014    Physical Findings: AIMS: Facial and Oral Movements Muscles of Facial Expression: None, normal Lips and Perioral Area: None, normal Jaw: None, normal Tongue: None, normal,Extremity Movements Upper (arms, wrists, hands, fingers): None, normal Lower (legs, knees, ankles, toes): None, normal, Trunk Movements Neck, shoulders, hips: None, normal, Overall Severity Severity of abnormal movements (highest score from questions above): None, normal Incapacitation due to abnormal movements: None, normal Patient's awareness of abnormal movements (rate only patient's report): No Awareness, Dental Status Current problems with teeth and/or dentures?: No Does patient usually wear dentures?: No  CIWA:    COWS:     Musculoskeletal: Strength & Muscle Tone: within normal limits Gait & Station: normal Patient leans: N/A  Psychiatric Specialty Exam: Physical Exam  Nursing note and vitals reviewed. Constitutional: He is oriented to person, place, and time. He appears well-developed and well-nourished.  Neck: Normal range of motion.  Cardiovascular: Normal rate.  Respiratory: Effort normal.  Musculoskeletal: Normal range of motion.  Neurological: He is alert and oriented to person, place, and  time.  Skin: Skin is warm and dry.    Review of Systems  Neurological: Negative.   Endo/Heme/Allergies: Negative.   Psychiatric/Behavioral: Depression: Improved. Suicidal ideas: Denies at this time. The patient is nervous/anxious.   no headache, no chest pain, no dyspnea, no vomiting   Blood pressure 123/73, pulse 67, temperature 98.2 F (36.8 C), temperature source Oral, resp. rate 18, height '5\' 9"'$  (1.753 m), weight 92.5 kg (204 lb), SpO2 99 %.Body mass index is 30.13 kg/m.  General Appearance: Well Groomed  Eye Contact:  Good  Speech:  Normal Rate  Volume:  Normal  Mood:  improving mood, less depressed, remains anxious   Affect:  more reactive, fuller in range, lingering anxiety  Thought Process:  Linear and Descriptions of Associations: Intact  Orientation:  Full (Time, Place, and Person)  Thought Content:  no hallucinations, no delusions, not internally preoccupied   Suicidal Thoughts:  No denies suicidal or  self injurious ideations, denies homicidal or violent ideations, contracts for safety on unit   Homicidal Thoughts:  No  Memory:  Recent and remote grossly intact   Judgement:  Other:  improved  Insight:  Improved  Psychomotor Activity:  Normal  Concentration:  Concentration: Good and Attention Span: Good  Recall:  Good  Fund of Knowledge:  Good  Language:  Good  Akathisia:  No  Handed:  Right  AIMS (if indicated):     Assets:  Communication Skills Desire for Improvement Physical Health Social Support Transportation  ADL's:  Intact  Cognition:  WNL  Sleep:  Number of Hours: 6.25  .  Assessment - patient is presenting with improved mood, denies SI, and seems less ruminative. In addition to depression he has also endorsed  anxiety as a significant symptom, and does remain vaguely anxious.  Thus far tolerating medications well . Buspar was recently titrated up- has tolerated titration well thus far .  Treatment Plan Summary: Treatment Plan reviewed as below today  11/17  Daily contact with patient to assess and evaluate symptoms and progress in treatment and Medication management  Continue    Buspar to 15 mg TID  for anxiety Continue Celexa  20 mg QDAY  for depression and anxiety  Continue  Vistaril 25 mg TID  PRN for anxiety Continue Trazodone 50 mg QHS PRN for insomnia Encourage group / milieu participation to work on Radiographer, therapeutic and symptom reduction Treatment team working on disposition planning options    Jenne Campus, MD 05/01/2017, 12:11 PM    Patient ID: [name], male   DOB: [birth year], 66 y.o.   MRN: [id number]

## 2017-05-02 MED ORDER — TRAZODONE HCL 50 MG PO TABS: 50.0000 mg | ORAL_TABLET | Freq: Every evening | ORAL | 0 refills | Status: DC | PRN

## 2017-05-02 MED ORDER — BUSPIRONE HCL 15 MG PO TABS: 15.0000 mg | ORAL_TABLET | Freq: Three times a day (TID) | ORAL | 0 refills | Status: DC

## 2017-05-02 MED ORDER — CITALOPRAM HYDROBROMIDE 20 MG PO TABS: 20.0000 mg | ORAL_TABLET | Freq: Every day | ORAL | 0 refills | Status: DC

## 2017-05-02 NOTE — Progress Notes (Signed)
Patient discharged to lobby. Patient was stable and appreciative at that time. All papers and prescriptions were given and valuables returned. Verbal understanding expressed. Denies SI/HI and A/VH. Patient given opportunity to express concerns and ask questions.  

## 2017-05-02 NOTE — Progress Notes (Signed)
  [facility] Adult Case Management Discharge Plan :  Will you be returning to the same living situation after discharge:  Yes,  pt returning home. At discharge, do you have transportation home?: Yes,  pt has access to transportation. Do you have the ability to pay for your medications: Yes,  pt has insurance.  Release of information consent forms completed and in the chart;  Patient's signature needed at discharge.  Patient to Follow up at: Follow-up [place] Follow up on 05/16/2017.   Why:  Hospital follow-up appointment 12/3 at 10am with [name]. This was the first available appointment with your preferred provider. Contact information: [address] [place] [address] [place] [facility] Follow up on 05/03/2017.   Specialty:  Behavioral Health Why:  Assessment appointment for the Intensive Outpatient Program 11/20 at 8:30am. Please call the office if you need to cancel or reschedule your appointment.  Contact information: [place] [phone] [place] [phone]          Next level of care provider has access to [place] and Suicide Prevention discussed: Yes,  with pt and with pt's daughter.  Have you used any form of tobacco in the last 30 days? (Cigarettes, Smokeless Tobacco, Cigars, and/or Pipes): No  Has patient been referred to the Quitline?: N/A patient is not a smoker  Patient has been referred for addiction treatment: [place], MSW, LCSWA 05/02/2017, 10:35 AM

## 2017-05-02 NOTE — Progress Notes (Signed)
D: Pt attended group. When asked about his day pt stated, "primo". Then he stated, "exceptional". Stated that on a "scale from 1 to 10 he would rate it an 11". Informed the writer that he "may be discharging tomorrow and plans to attend IOP". Pt has no questions or concerns.   A:  Support and encouragement was offered. 15 min checks continued for safety.  R: Pt remains safe.

## 2017-05-02 NOTE — Discharge Summary (Signed)
Physician Discharge Summary Note  Patient:  Maurice Duffy is an 66 y.o., male MRN:  [id number] DOB:  06-Jun-1951 Patient phone:  [phone] (home)  Patient address:   [address] [place] [address],  Total Time spent with patient: 30 minutes  Date of Admission:  04/25/2017 Date of Discharge: 05/02/2017  Reason for Admission:Per HPI- 66 year old married male, who reports long history of mood disorder ( depression), and anxiety, which he describes as a free floating sense of anxiety, worry . States that he had been off psychiatric medications for a long period of time due to cost, insurance constraints. He states he has been experiencing increased depression and anxiety recently , which he attributes partly to concerns about his wife's physical health ( has RA), and due to political issues, mainly Nurse, adult. States " I just got so caught up with Maurice Duffy's hearing, and I started getting more anxious about the whole thing". Presented to the hospital voluntarily due to worsening anxiety, depression, and recent suicidal ideations, without any specific plan or intention. States he has been in the process of changing psychiatric medications from Lexapro to [place] via his PCP. States he has been on Celexa and Buspar x a few days, prior to which he had been on Lexapro x 1 month with no response . Endorses neuro- vegetative symptoms of depression as below.    Principal Problem: MDD (major depressive disorder), recurrent episode, severe [facility]) Discharge Diagnoses: Patient Active Problem List   Diagnosis Date Noted  . GAD (generalized anxiety disorder) [F41.1]   . MDD (major depressive disorder), recurrent episode, severe ([place]) [F33.2] 04/25/2017  . GERD (gastroesophageal reflux disease) [K21.9] 08/22/2014  . H/O left inguinal hernia repair [S34.196, Z87.19] 08/22/2014  . Hyperlipidemia [E78.5] 09/14/2013  . Bipolar 2 disorder ([place]) [F31.81] 12/22/2011  .  Obesity [E66.9] 12/22/2011  . CAD (coronary artery disease) [I25.10] 12/22/2011    Past Psychiatric History:   Past Medical History:  Past Medical History:  Diagnosis Date  . Anxiety   . Coronary artery disease   . Depression   . Hyperlipidemia     Past Surgical History:  Procedure Laterality Date  . CORONARY ANGIOPLASTY WITH STENT PLACEMENT    . HERNIA REPAIR    . NM MYOVIEW LTD  2013   Family History:  Family History  Problem Relation Age of Onset  . Coronary artery disease Father 43  . Cancer Father   . Heart disease Father   . Hyperlipidemia Father   . Hypertension Father    Family Psychiatric  History:  Social History:  Social History   Substance and Sexual Activity  Alcohol Use No     Social History   Substance and Sexual Activity  Drug Use No    Social History   Socioeconomic History  . Marital status: Married    Spouse name: None  . Number of children: None  . Years of education: None  . Highest education level: None  Social Needs  . Financial resource strain: None  . Food insecurity - worry: None  . Food insecurity - inability: None  . Transportation needs - medical: None  . Transportation needs - non-medical: None  Occupational History  . Occupation: Equities trader  Tobacco Use  . Smoking status: Former Smoker    Packs/day: 1.00    Years: 18.00    Pack years: 18.00    Last attempt to quit: 08/13/2011    Years since quitting: 5.7  .  Smokeless tobacco: Never Used  Substance and Sexual Activity  . Alcohol use: No  . Drug use: No  . Sexual activity: Yes  Other Topics Concern  . None  Social History Narrative   Married. Education: [employer].    Hospital Course:  Maurice Duffy was admitted for MDD (major depressive disorder), recurrent episode, severe ([place]) and crisis management.  Pt was treated discharged with the medications listed below under Medication List.  Medical problems were identified and treated as needed.  Home medications were  restarted as appropriate.  Improvement was monitored by observation and Maurice Duffy 's daily report of symptom reduction.  Emotional and mental status was monitored by daily self-inventory reports completed by Maurice Duffy and clinical staff.         Maurice Duffy was evaluated by the treatment team for stability and plans for continued recovery upon discharge. Maurice Duffy 's motivation was an integral factor for scheduling further treatment. Employment, transportation, bed availability, health status, family support, and any pending legal issues were also considered during hospital stay. Pt was offered further treatment options upon discharge including but not limited to Residential, Intensive Outpatient, and Outpatient treatment.  Maurice Duffy will follow up with the services as listed below under Follow Up Information.     Upon completion of this admission the patient was both mentally and medically stable for discharge denying suicidal/homicidal ideation, auditory/visual/tactile hallucinations, delusional thoughts and paranoia.    Maurice Duffy responded well to treatment with Buspar, Celexa 20 mg and Trazodone 50 mg without adverse effects.Pt demonstrated improvement without reported or observed adverse effects to the point of stability appropriate for outpatient management. Pertinent labs include:CMP, CBC for which outpatient follow-up is necessary for lab recheck as mentioned below. Reviewed CBC, CMP, BAL, and UDS; all unremarkable aside from noted exceptions.   Physical Findings: AIMS: Facial and Oral Movements Muscles of Facial Expression: None, normal Lips and Perioral Area: None, normal Jaw: None, normal Tongue: None, normal,Extremity Movements Upper (arms, wrists, hands, fingers): None, normal Lower (legs, knees, ankles, toes): None, normal, Trunk Movements Neck, shoulders, hips: None, normal, Overall Severity Severity of abnormal movements (highest score from questions above): None,  normal Incapacitation due to abnormal movements: None, normal Patient's awareness of abnormal movements (rate only patient's report): No Awareness, Dental Status Current problems with teeth and/or dentures?: No Does patient usually wear dentures?: No  CIWA:    COWS:     Musculoskeletal: Strength & Muscle Tone: within normal limits Gait & Station: normal Patient leans: N/A  Psychiatric Specialty Exam: See SRA by MD Physical Exam  Nursing note and vitals reviewed. Constitutional: He is oriented to person, place, and time. He appears well-developed.  Cardiovascular: Normal rate.  Neurological: He is alert and oriented to person, place, and time.  Psychiatric: He has a normal mood and affect. His behavior is normal.    Review of Systems  Psychiatric/Behavioral: Negative for depression (stable) and substance abuse. The patient is not nervous/anxious.   All other systems reviewed and are negative.   Blood pressure (!) 141/83, pulse 69, temperature 98.3 F (36.8 C), temperature source Oral, resp. rate 16, height 5\' 9"  (1.753 m), weight 92.5 kg (204 lb), SpO2 99 %.Body mass index is 30.13 kg/m.   Have you used any form of tobacco in the last 30 days? (Cigarettes, Smokeless Tobacco, Cigars, and/or Pipes): No  Has this patient used any form of tobacco in the last 30 days? (Cigarettes, Smokeless Tobacco, Cigars,  and/or Pipes) No  Blood Alcohol level:  No results found for: [facility]  Metabolic Disorder Labs:   No results found for: PROLACTIN Lab Results  Component Value Date   CHOL 186 02/26/2016   TRIG 250 (H) 02/26/2016   HDL 42 02/26/2016   CHOLHDL 4.4 02/26/2016   VLDL 50 (H) 02/26/2016   LDLCALC 94 02/26/2016   LDLCALC 53 08/22/2014    See Psychiatric Specialty Exam and Suicide Risk Assessment completed by Attending Physician prior to discharge.  Discharge destination:  Home  Is patient on multiple antipsychotic therapies at discharge:  No   Has Patient had three or more  failed trials of antipsychotic monotherapy by history:  No  Recommended Plan for Multiple Antipsychotic Therapies: NA  Discharge Instructions    Diet - low sodium heart healthy   Complete by:  As directed    Discharge instructions   Complete by:  As directed    Take all medications as prescribed. Keep all follow-up appointments as scheduled.  Do not consume alcohol or use illegal drugs while on prescription medications. Report any adverse effects from your medications to your primary care provider promptly.  In the event of recurrent symptoms or worsening symptoms, call 911, a crisis hotline, or go to the nearest emergency department for evaluation.   Increase activity slowly   Complete by:  As directed      Allergies as of 05/02/2017   No Known Allergies     Medication List    STOP taking these medications   aspirin 325 MG tablet   clopidogrel 75 MG tablet Commonly known as:  PLAVIX   simvastatin 20 MG tablet Commonly known as:  ZOCOR     TAKE these medications     Indication  azelastine 0.05 % ophthalmic solution Commonly known as:  OPTIVAR Place 1 drop into both eyes 2 (two) times daily.  Indication:  Allergic Conjunctivitis   busPIRone 15 MG tablet Commonly known as:  BUSPAR Take 1 tablet (15 mg total) 3 (three) times daily by mouth. What changed:  when to take this  Indication:  Anxiety Disorder, Major Depressive Disorder   citalopram 20 MG tablet Commonly known as:  CELEXA Take 1 tablet (20 mg total) daily by mouth. Start taking on:  05/03/2017 What changed:  when to take this  Indication:  Aggressive Behavior, Depression   traZODone 50 MG tablet Commonly known as:  DESYREL Take 1 tablet (50 mg total) at bedtime as needed by mouth for sleep.  Indication:  Anxiety Disorder, [place], [facility] Follow up on 05/16/2017.   Why:  Hospital follow-up appointment 12/3 at 10am with [name]. This  was the first available appointment with your preferred provider. Contact information: [address] [place] [address] [place] [facility] Follow up on 05/03/2017.   Specialty:  Behavioral Health Why:  Assessment appointment for the Intensive Outpatient Program 11/20 at 8:30am. Please call the office if you need to cancel or reschedule your appointment.  Contact information: [place] [phone] [place] [phone]          Follow-up recommendations:  Activity:  as tolerated Diet:  heart healthy  Comments:  Take all medications as prescribed. Keep all follow-up appointments as scheduled.  Do not consume alcohol or use illegal drugs while on prescription medications. Report any adverse effects  from your medications to your primary care provider promptly.  In the event of recurrent symptoms or worsening symptoms, call 911, a crisis hotline, or go to the nearest emergency department for evaluation.   Signed: Derrill [place], NP 05/02/2017, 12:34 PM   Patient seen, Suicide Assessment Completed.  Disposition Plan Reviewed

## 2017-05-02 NOTE — BHH Suicide Risk Assessment (Signed)
[facility] Discharge Suicide Risk Assessment   Principal Problem: MDD (major depressive disorder), recurrent episode, severe ([place]) Discharge Diagnoses:  Patient Active Problem List   Diagnosis Date Noted  . GAD (generalized anxiety disorder) [F41.1]   . MDD (major depressive disorder), recurrent episode, severe ([place]) [F33.2] 04/25/2017  . GERD (gastroesophageal reflux disease) [K21.9] 08/22/2014  . H/O left inguinal hernia repair [W29.937, Z87.19] 08/22/2014  . Hyperlipidemia [E78.5] 09/14/2013  . Bipolar 2 disorder ([place]) [F31.81] 12/22/2011  . Obesity [E66.9] 12/22/2011  . CAD (coronary artery disease) [I25.10] 12/22/2011    Total Time spent with patient: 30 minutes  Musculoskeletal: Strength & Muscle Tone: within normal limits Gait & Station: normal Patient leans: N/A  Psychiatric Specialty Exam: ROS denies headache, no chest pain, no shortness of breath, no vomiting   Blood pressure (!) 141/83, pulse 69, temperature 98.3 F (36.8 C), temperature source Oral, resp. rate 16, height 5\' 9"  (1.753 m), weight 92.5 kg (204 lb), SpO2 99 %.Body mass index is 30.13 kg/m.  General Appearance: Well Groomed  Eye Contact::  Good  Speech:  Normal Rate409  Volume:  Normal  Mood:  reports mood is " very good right now", denies feeling depressed   Affect:  Appropriate and vaguely anxious   Thought Process:  Linear and Descriptions of Associations: Intact  Orientation:  Other:  fully alert and attentive   Thought Content:  denies hallucinations, no delusions, not internally preoccupied   Suicidal Thoughts:  No denies any suicidal or self injurious ideations, no homicidal or violent ideations   Homicidal Thoughts:  No  Memory:  recent and remote grossly intact   Judgement:  Other:  improved   Insight:  improved   Psychomotor Activity:  Normal  Concentration:  Good  Recall:  Good  Fund of Knowledge:Good  Language: Good  Akathisia:  Negative  Handed:  Right  AIMS (if indicated):     Assets:   Communication Skills Desire for Improvement Resilience  Sleep:  Number of Hours: 6.25  Cognition: WNL  ADL's:  Intact   Mental Status Per Nursing Assessment::   On Admission:     Demographic Factors:  66 year old married male, lives with wife, has one adult daughter, part time employment   Loss Factors: Being off psychiatric medications, reported being concerned about wife's medical illnesses, and about national politics   Historical Factors: History of depression, anxiety, reports he has never attempted suicide . No history of psychosis  Risk Reduction Factors:   Sense of responsibility to family, Employed and Positive coping skills or problem solving skills  Continued Clinical Symptoms:  At this time patient presents improved compared to admission. Presents alert, attentive, well related, mood improved and denies feeling depressed, anxiety decreased, improved as well. Affect more reactive, no thought disorder, no suicidal or self injurious ideations, no homicidal or violent ideations, no psychotic symptoms, future oriented . Denies medication side effects and reports he feels they are working well for him Behavior on unit in good control, pleasant on approach.  Cognitive Features That Contribute To Risk:  No gross cognitive deficits noted upon discharge. Is alert , attentive, and oriented x 3   Suicide Risk:  Mild:  Suicidal ideation of limited frequency, intensity, duration, and specificity.  There are no identifiable plans, no associated intent, mild dysphoria and related symptoms, good self-control (both objective and subjective assessment), few other risk factors, and identifiable protective factors, including available and accessible social support.  [place] Follow  up on 05/16/2017.   Why:  Hospital follow-up appointment 12/3 at 10am with [name]. This was the first available appointment with your preferred  provider. Contact information: [address] [place] [address] [place] [facility] Follow up on 05/03/2017.   Specialty:  Behavioral Health Why:  Assessment appointment for the Intensive Outpatient Program 11/20 at 8:30am. Please call the office if you need to cancel or reschedule your appointment.  Contact information: [place] [phone] [place] [phone]          Plan Of Care/Follow-up recommendations:  Activity:  as tolerated  Diet:  regular Tests:  NA Other:  see below Patient expresses readiness for discharge and is leaving unit in good spirits  Plans to return home States wife will be picking up later today Plans to follow up as above  He has an established PCP , Dr. Andria Frames.  Jenne Campus, MD 05/02/2017, 2:30 PM

## 2017-05-02 NOTE — Progress Notes (Signed)
Recreation Therapy Notes  Date: 05/02/17 Time: 0930 Location:  [address] Dayroom  Group Topic: Stress Management  Goal Area(s) Addresses:  Patient will verbalize importance of using healthy stress management.  Patient will identify positive emotions associated with healthy stress management.   Behavioral Response: Engaged  Intervention: Stress Management  Activity :  Meditation.  LRT introduced the stress management technique of meditation.  LRT read a script for patients to exam the things they need to shed the baggage they have been carrying to prepare for the new things to come.  Education:  Stress Management, Discharge Planning.   Education Outcome: Acknowledges edcuation/In group clarification offered/Needs additional education  Clinical Observations/Feedback: Pt attended group.     Victorino Sparrow, LRT/CTRS         Victorino Sparrow A 05/02/2017 11:24 AM

## 2017-05-02 NOTE — Tx Team (Signed)
Interdisciplinary Treatment and Diagnostic Plan Update 05/02/2017 Time of Session: 9:30am  Maurice Duffy  MRN: [id number]  Principal Diagnosis:  MDD, no psychotic features     Secondary Diagnoses: Principal Problem:   MDD (major depressive disorder), recurrent episode, severe ([place]) Active Problems:   GAD (generalized anxiety disorder)   Current Medications:  Current Facility-Administered Medications  Medication Dose Route Frequency Provider Last Rate Last Dose  . acetaminophen (TYLENOL) tablet 650 mg  650 mg Oral Q6H PRN Elmarie Shiley A, NP   650 mg at 05/02/17 0834  . alum & mag hydroxide-simeth (MAALOX/MYLANTA) 200-200-20 MG/5ML suspension 30 mL  30 mL Oral Q4H PRN Elmarie Shiley A, NP      . busPIRone (BUSPAR) tablet 15 mg  15 mg Oral TID Rankin, Shuvon B, NP   15 mg at 05/02/17 0834  . citalopram (CELEXA) tablet 20 mg  20 mg Oral Daily Cobos, Fernando A, MD   20 mg at 05/02/17 0834  . hydrOXYzine (ATARAX/VISTARIL) tablet 25 mg  25 mg Oral TID PRN Rankin, Shuvon B, NP   25 mg at 05/01/17 2214  . magnesium hydroxide (MILK OF MAGNESIA) suspension 30 mL  30 mL Oral Daily PRN Niel Hummer, NP      . traZODone (DESYREL) tablet 50 mg  50 mg Oral QHS PRN Niel Hummer, NP   50 mg at 05/01/17 2214    PTA Medications: Medications Prior to Admission  Medication Sig Dispense Refill Last Dose  . aspirin 325 MG tablet Take 325 mg daily by mouth.   04/24/2017  . busPIRone (BUSPAR) 15 MG tablet Take 1 tablet (15 mg total) by mouth 2 (two) times daily. (Patient taking differently: Take 7.5 mg 2 (two) times daily by mouth. ) 60 tablet 1 04/25/2017  . citalopram (CELEXA) 20 MG tablet Take 1 tablet (20 mg total) by mouth daily. (Patient taking differently: Take 20 mg at bedtime by mouth. ) 30 tablet 1 04/24/2017  . azelastine (OPTIVAR) 0.05 % ophthalmic solution Place 1 drop into both eyes 2 (two) times daily. (Patient not taking: Reported on 04/26/2017) 6 mL 12 Not Taking at Unknown time  .  clopidogrel (PLAVIX) 75 MG tablet Take 1 tablet (75 mg total) by mouth daily. (Patient not taking: Reported on 04/26/2017) 90 tablet 0 Not Taking at Unknown time  . simvastatin (ZOCOR) 20 MG tablet Take 1 tablet (20 mg total) by mouth every evening. (Patient not taking: Reported on 04/26/2017) 90 tablet 0 Not Taking at Unknown time    Treatment Modalities: Medication Management, Group therapy, Case management,  1 to 1 session with clinician, Psychoeducation, Recreational therapy.  Patient Stressors: Financial difficulties Health problems Patient Strengths: Average or above average intelligence Communication skills Supportive family/friends  Physician Treatment Plan for Primary Diagnosis:  MDD, no psychotic features    Long Term Goal(s): Improvement in symptoms so as ready for discharge Short Term Goals: Ability to verbalize feelings will improve Ability to disclose and discuss suicidal ideas Ability to demonstrate self-control will improve Ability to identify and develop effective coping behaviors will improve Ability to maintain clinical measurements within normal limits will improve Ability to identify changes in lifestyle to reduce recurrence of condition will improve Ability to identify triggers associated with substance abuse/mental health issues will improve  Medication Management: Evaluate patient's response, side effects, and tolerance of medication regimen.  Therapeutic Interventions: 1 to 1 sessions, Unit Group sessions and Medication administration.  Evaluation of Outcomes: Adequate for Discharge  Physician Treatment Plan for  Secondary Diagnosis: Principal Problem:   MDD (major depressive disorder), recurrent episode, severe ([place]) Active Problems:   GAD (generalized anxiety disorder)  Long Term Goal(s): Improvement in symptoms so as ready for discharge  Short Term Goals: Ability to verbalize feelings will improve Ability to disclose and discuss suicidal  ideas Ability to demonstrate self-control will improve Ability to identify and develop effective coping behaviors will improve Ability to maintain clinical measurements within normal limits will improve Ability to identify changes in lifestyle to reduce recurrence of condition will improve Ability to identify triggers associated with substance abuse/mental health issues will improve  Medication Management: Evaluate patient's response, side effects, and tolerance of medication regimen.  Therapeutic Interventions: 1 to 1 sessions, Unit Group sessions and Medication administration.  Evaluation of Outcomes: Adequate for Discharge  RN Treatment Plan for Primary Diagnosis:  MDD, no psychotic features    Long Term Goal(s): Knowledge of disease and therapeutic regimen to maintain health will improve  Short Term Goals: Compliance with prescribed medications will improve  Medication Management: RN will administer medications as ordered by provider, will assess and evaluate patient's response and provide education to patient for prescribed medication. RN will report any adverse and/or side effects to prescribing provider.  Therapeutic Interventions: 1 on 1 counseling sessions, Psychoeducation, Medication administration, Evaluate responses to treatment, Monitor vital signs and CBGs as ordered, Perform/monitor CIWA, COWS, AIMS and Fall Risk screenings as ordered, Perform wound care treatments as ordered.  Evaluation of Outcomes: Adequate for Discharge  LCSW Treatment Plan for Primary Diagnosis:  MDD, no psychotic features    Long Term Goal(s): Safe transition to appropriate next level of care at discharge, Engage patient in therapeutic group addressing interpersonal concerns. Short Term Goals: Engage patient in aftercare planning with referrals and resources, Increase emotional regulation, Identify triggers associated with mental health/substance abuse issues and Increase skills for wellness and  recovery  Therapeutic Interventions: Assess for all discharge needs, 1 to 1 time with Social worker, Explore available resources and support systems, Assess for adequacy in community support network, Educate family and significant other(s) on suicide prevention, Complete Psychosocial Assessment, Interpersonal group therapy.  Evaluation of Outcomes: Adequate for Discharge  Progress in Treatment: Attending groups: Yes Participating in groups: Yes Taking medication as prescribed: Yes, MD continues to assess for medication changes as needed Toleration medication: Yes, no side effects reported at this time Family/Significant other contact made: Yes, pt's daughter contacted. Patient understands diagnosis: Yes, AEB pt's willingness to participate in treatment.  Discussing patient identified problems/goals with staff: Yes Medical problems stabilized or resolved: Yes Denies suicidal/homicidal ideation: Yes  Issues/concerns per patient self-inventory: None Other: N/A  New problem(s) identified: None identified at this time.   New Short Term/Long Term Goal(s): None identified at this time.   Discharge Plan or Barriers: Pt will return home and follow up outpatient with [facility].  Reason for Continuation of Hospitalization:  None identified at this time.  Estimated Length of Stay: 0 days; Pt will likely discharge today 05/02/17 Attendees: Patient: 05/02/2017 11:49 AM  Physician: Dr. Parke Poisson 05/02/2017 11:49 AM  Nursing: Trinna Post RN; Sharl Ma, RN 05/02/2017 11:49 AM  RN Care Manager: Lars Pinks, RN 05/02/2017 11:49 AM  Social Worker: Matthew Saras, [place] 05/02/2017 11:49 AM  Recreational Therapist:  05/02/2017 11:49 AM  Other: Lindell Spar, NP 05/02/2017 11:49 AM  Other:  05/02/2017 11:49 AM  Other: 05/02/2017 11:49 AM  Scribe for Treatment Team: Georga Kaufmann, MSW,LCSWA 05/02/2017 11:49 AM

## 2017-05-04 NOTE — Progress Notes (Signed)
Patient ID: Maurice Duffy, male   DOB: March 27, 1951, 66 y.o.   MRN: [id number]  Entry date 05/04/2017 1654-  Maurice Duffy contacted writer this afternoon to state that he did not get his prescription for Vistaril 25 mg upon discharge. Per instruction of Lindell Spar NP, Arts administrator on [facility] [place] [phone]) and spoke with pharmacist Nira Conn. A prescription was given, by Lindell Spar NP, for Vistaril 25 mg every 6 hours as needed for anxiety #60. [name] was given this information. There are to be no refills.

## 2017-05-10 ENCOUNTER — Encounter (HOSPITAL_COMMUNITY): Payer: Self-pay | Admitting: Psychiatry

## 2017-05-10 ENCOUNTER — Other Ambulatory Visit (HOSPITAL_COMMUNITY): Payer: 59 | Attending: Psychiatry | Admitting: Psychiatry

## 2017-05-10 DIAGNOSIS — F419 Anxiety disorder, unspecified: Secondary | ICD-10-CM | POA: Insufficient documentation

## 2017-05-10 DIAGNOSIS — Z87891 Personal history of nicotine dependence: Secondary | ICD-10-CM | POA: Insufficient documentation

## 2017-05-10 DIAGNOSIS — E785 Hyperlipidemia, unspecified: Secondary | ICD-10-CM | POA: Insufficient documentation

## 2017-05-10 DIAGNOSIS — I2510 Atherosclerotic heart disease of native coronary artery without angina pectoris: Secondary | ICD-10-CM | POA: Diagnosis not present

## 2017-05-10 DIAGNOSIS — F332 Major depressive disorder, recurrent severe without psychotic features: Secondary | ICD-10-CM

## 2017-05-10 DIAGNOSIS — F339 Major depressive disorder, recurrent, unspecified: Secondary | ICD-10-CM | POA: Insufficient documentation

## 2017-05-10 NOTE — Progress Notes (Signed)
Comprehensive Clinical Assessment (CCA) Note  05/10/2017 CHAISE Duffy [id number]  Visit Diagnosis:   No diagnosis found.    CCA Part One  Part One has been completed on paper by the patient.  (See scanned document in Chart Review)  CCA Part Two A  Intake/Chief Complaint:  CCA Intake With Chief Complaint CCA Part Two Date: 05/10/17 CCA Part Two Time: 1347 Chief Complaint/Presenting Problem: This is a 66 yr old, married, Caucasian male who was transitioned from the inpatient psychiatric unit at [facility].  Pt was admitted there from 04-25-17 through 05-02-17; treatment for worsening depressive and anxiety symptoms with SI.  States he didn't have a plan or intent.  Currently denies any SI.  Discussed safety options with pt at length.  Pt also denies HI or A/V hallucinations.  Triggers/Stressors:  1)  Recent hearing of Kavanaugh and other political issues.  "I just got caught up in all of it.  I didn't know him or anything, but I was watching the hearing."  2)  Caregiver:  Wife of 31 yrs marriage has Arthritis and he assists her.  She is also a Marine scientist and works for [employer] from home.  3)  Daughter:  Pt states he worries about his 87 yr old daughter's financial situation.  Her husband is currently in [place] and they are having a financial strain.  "I am feeling better, but I still feel a little fragile."  Pt has a new pt upcoming appt with [name] on 05-18-17.  Reports no other prior psychiatric admits besides the recent one at [facility].  Saw psychiatrist in 1993 and that's when he was dx'd with major depression.  States he was started on medications, but stopped them ~ two yrs ago; due to cost.  Denies any prior suicide attempts or gestures.  Family hx:  Deceased father (anxiety).                                                             Patients Currently Reported Symptoms/Problems: Anxiety, sadness, no energy, irritable, poor concentration, anhedonia, no motivation Collateral Involvement: Reports  wife and daughter are supportive. Individual's Strengths: Pt is talkative and motivated for treatment. Individual's Abilities: Comfortable talking/sharing. Type of Services Patient Feels Are Needed: MH-IOP  Mental Health Symptoms Depression:  Depression: Change in energy/activity, Difficulty Concentrating, Fatigue, Irritability  Mania:  Mania: N/A  Anxiety:   Anxiety: Worrying  Psychosis:  Psychosis: N/A  Trauma:  Trauma: N/A  Obsessions:  Obsessions: N/A  Compulsions:  Compulsions: N/A  Inattention:  Inattention: N/A  Hyperactivity/Impulsivity:  Hyperactivity/Impulsivity: N/A  Oppositional/Defiant Behaviors:  Oppositional/Defiant Behaviors: N/A  Borderline Personality:  Emotional Irregularity: N/A  Other Mood/Personality Symptoms:      Mental Status Exam Appearance and self-care  Stature:  Stature: Average  Weight:  Weight: Average weight  Clothing:  Clothing: Casual  Grooming:  Grooming: Normal  Cosmetic use:  Cosmetic Use: None  Posture/gait:  Posture/Gait: Normal  Motor activity:  Motor Activity: Not Remarkable  Sensorium  Attention:  Attention: Distractible  Concentration:  Concentration: Preoccupied  Orientation:  Orientation: X5  Recall/memory:  Recall/Memory: Normal  Affect and Mood  Affect:  Affect: Anxious  Mood:  Mood: Depressed  Relating  Eye contact:  Eye Contact: Normal  Facial expression:  Facial Expression: Responsive  Attitude  toward examiner:  Attitude Toward Examiner: Cooperative  Thought and Language  Speech flow: Speech Flow: Normal  Thought content:  Thought Content: Appropriate to mood and circumstances  Preoccupation:  Preoccupations: Ruminations  Hallucinations:     Organization:     Transport planner of Knowledge:  Fund of Knowledge: Average  Intelligence:  Intelligence: Average  Abstraction:  Abstraction: Normal  Judgement:  Judgement: Fair  Art therapist:  Reality Testing: Distorted  Insight:  Insight: Gaps  Decision Making:   Decision Making: Vacilates  Social Functioning  Social Maturity:  Social Maturity: Isolates  Social Judgement:  Social Judgement: Normal  Stress  Stressors:  Stressors: Illness, Transitions  Coping Ability:  Coping Ability: Normal  Skill Deficits:     Supports:      Family and Psychosocial History: Family history Marital status: Married Number of Years Married: 55 What types of issues is patient dealing with in the relationship?: wife is a Marine scientist and has been very supportive Are you sexually active?: Yes What is your sexual orientation?: straight Has your sexual activity been affected by drugs, alcohol, medication, or emotional stress?: we've been abstaining because of wife's rheumatoid arthritis-she's been going downhill pyhsically How many children?: 1 How is patient's relationship with their children?: 11  wonderful relationship-she's married  "She says I try to live in the past with her too much"  Childhood History:  Childhood History By whom was/is the patient raised?: Both parents Additional childhood history information: Born in [place].  Father was a Insurance underwriter in the [employer].  He was verbally abusive.  No problems in school.  "I never wanted to graduate."  Pt denies any trauma. Description of patient's relationship with caregiver when they were a child: good-little dicey with father-"he was a black and white thinker" Patient's description of current relationship with people who raised him/her: both passed-daddy was sick-heart attack, mommy died  40 years ago Does patient have siblings?: Yes Number of Siblings: 2 Description of patient's current relationship with siblings: good relationship with younger sister Did patient suffer any verbal/emotional/physical/sexual abuse as a child?: No Did patient suffer from severe childhood neglect?: No Has patient ever been sexually abused/assaulted/raped as an adolescent or adult?: No Was the patient ever a victim of a crime or a  disaster?: No Witnessed domestic violence?: No Has patient been effected by domestic violence as an adult?: No  CCA Part Two B  Employment/Work Situation: Employment / Work Copywriter, advertising Employment situation: Retired Archivist job has been impacted by current illness: No What is the longest time patient has a held a job?: been retired 2 years-working part time at [employer]- prior to that 30 years 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 Has patient ever been in the [employer]?: No Has patient ever served in combat?: No Did You Receive Any Psychiatric Treatment/Services While in Passenger transport manager?: No Are There Guns or Other Weapons in [place]?: No  Education: Education Did Teacher, adult education From [employer]?: Yes Did Physicist, medical?: Yes What Type of College Degree Do you Have?: BSN Did Heritage manager?: No What Was Your Major?: Nursing Did You Have An Individualized Education Program (IIEP): No Did You Have Any Difficulty At [employer]?: No  Religion:    Leisure/Recreation: Leisure / Recreation Leisure and Hobbies: like to go to gym-24 hr fitness  Exercise/Diet: Exercise/Diet Do You Exercise?: Yes What Type of Exercise Do You Do?: Swimming How Many Times a Week Do  You Exercise?: 1-3 times a week Have You Gained or Lost A Significant Amount of Weight in the Past Six Months?: No Do You Follow a Special Diet?: No Do You Have Any Trouble Sleeping?: No  CCA Part Two C  Alcohol/Drug Use: Alcohol / Drug Use Pain Medications: see PTA meds Prescriptions: see PTA meds Over the Counter: see PTA meds History of alcohol / drug use?: No history of alcohol / drug abuse                      CCA Part Three  ASAM's:  Six Dimensions of Multidimensional Assessment  Dimension 1:  Acute Intoxication and/or Withdrawal Potential:     Dimension 2:  Biomedical Conditions and Complications:     Dimension 3:  Emotional,  Behavioral, or Cognitive Conditions and Complications:     Dimension 4:  Readiness to Change:     Dimension 5:  Relapse, Continued use, or Continued Problem Potential:     Dimension 6:  Recovery/Living Environment:      Substance use Disorder (SUD)    Social Function:  Social Functioning Social Maturity: Isolates Social Judgement: Normal  Stress:  Stress Stressors: Illness, Transitions Coping Ability: Normal Patient Takes Medications The Way The Doctor Instructed?: Yes Priority Risk: Moderate Risk  Risk Assessment- Self-Harm Potential: Risk Assessment For Self-Harm Potential Thoughts of Self-Harm: No current thoughts Method: No plan Availability of Means: No access/NA Additional Comments for Self-Harm Potential: Pt denies any SI.  Risk Assessment -Dangerous to Others Potential: Risk Assessment For Dangerous to Others Potential Method: No Plan Availability of Means: No access or NA Intent: Vague intent or NA Notification Required: No need or identified person  DSM5 Diagnoses: Patient Active Problem List   Diagnosis Date Noted  . GAD (generalized anxiety disorder)   . MDD (major depressive disorder), recurrent episode, severe ([place]) 04/25/2017  . GERD (gastroesophageal reflux disease) 08/22/2014  . H/O left inguinal hernia repair 08/22/2014  . Hyperlipidemia 09/14/2013  . Bipolar 2 disorder ([place]) 12/22/2011  . Obesity 12/22/2011  . CAD (coronary artery disease) 12/22/2011    Patient Centered Plan: Patient is on the following Treatment Plan(s):  Anxiety and Depression  Recommendations for Services/Supports/Treatments: Recommendations for Services/Supports/Treatments Recommendations For Services/Supports/Treatments: IOP (Intensive Outpatient Program)  Treatment Plan Summary: Oriented pt to MH-IOP.  Provided pt with an orientation folder.  Will refer pt to a therapist [facility], LCSW) for tx for anxiety.  Encouraged support groups.  Referrals to Alternative  Service(s): Referred to Alternative Service(s):   Place:   Date:   Time:    Referred to Alternative Service(s):   Place:   Date:   Time:    Referred to Alternative Service(s):   Place:   Date:   Time:    Referred to Alternative Service(s):   Place:   Date:   Time:     , RITA, M.Ed, CNA

## 2017-05-10 NOTE — Progress Notes (Signed)
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  Stopped his medication 2 years ago and did well until about a month ago.  The stimulus for this depression appeared to be the nomination process for the [employer].  He is very anti abortion and was so afraid the candidate would not be approved.  His wife is struggling with rheumatoid arthritis and that worries him as well.  Otherwise he says he has a good life and should be happy.  Apparently according to the record he was having some suicidal thoughts but today he asks that we not mention that "s" word and says he is not having those thoughts.  He does worry about his only child's finances as her husband is in divinity school.  His spiritual life is very important.  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  His marriage is good and his wife still works as a Marine scientist.  Currently depression is much improved but anxiety keeps cropping up.  He is afraid if he is not in a program he will slip back into his depression and extreme anxiety. "I am fragile".  Associated Signs/Symptoms: Depression Symptoms:  depressed mood, fatigue, anxiety, (Hypo) Manic Symptoms:  Irritable Mood, Anxiety Symptoms:  Excessive Worry, Psychotic Symptoms:  none PTSD Symptoms: Negative  Past Psychiatric History: one inpatient and 2 episodes of outpatient therapy over the past 25 years  Previous Psychotropic Medications: Yes   Substance Abuse History in the last 12 months:  No.  Consequences of Substance Abuse: Negative  Past  Medical History:  Past Medical History:  Diagnosis Date  . Anxiety   . Coronary artery disease   . Depression   . Hyperlipidemia     Past Surgical History:  Procedure Laterality Date  . CORONARY ANGIOPLASTY WITH STENT PLACEMENT    . HERNIA REPAIR    . NM MYOVIEW LTD  2013    Family Psychiatric History: none  Family History:  Family History  Problem Relation Age of Onset  . Coronary artery disease Father 18  . Cancer Father   . Heart disease Father   . Hyperlipidemia Father   . Hypertension Father     Social History:   Social History   Socioeconomic History  . Marital status: Married    Spouse name: Not on file  . Number of children: Not on file  . Years of education: Not on file  . Highest education level: Not on file  Social Needs  . Financial resource strain: Not on file  . Food insecurity - worry: Not on file  . Food insecurity - inability: Not on file  . Transportation needs - medical: Not on file  . Transportation needs - non-medical: Not on file  Occupational History  . Occupation: Equities trader  Tobacco Use  . Smoking status: Former Smoker    Packs/day: 1.00    Years: 18.00    Pack years: 18.00    Last attempt to quit: 08/13/2011    Years since quitting: 5.7  . Smokeless tobacco: Never Used  Substance and Sexual Activity  .  Alcohol use: No  . Drug use: No  . Sexual activity: Yes  Other Topics Concern  . Not on file  Social History Narrative   Married. Education: [employer].    Additional Social History: none  Allergies:  No Known Allergies  Metabolic Disorder Labs:  No results found for: PROLACTIN Lab Results  Component Value Date   CHOL 186 02/26/2016   TRIG 250 (H) 02/26/2016   HDL 42 02/26/2016   CHOLHDL 4.4 02/26/2016   VLDL 50 (H) 02/26/2016   LDLCALC 94 02/26/2016   LDLCALC 53 08/22/2014     Current Medications: Current Outpatient Medications  Medication Sig Dispense Refill  . azelastine (OPTIVAR) 0.05 % ophthalmic  solution Place 1 drop into both eyes 2 (two) times daily. (Patient not taking: Reported on 04/26/2017) 6 mL 12  . busPIRone (BUSPAR) 15 MG tablet Take 1 tablet (15 mg total) 3 (three) times daily by mouth. 30 tablet 0  . citalopram (CELEXA) 20 MG tablet Take 1 tablet (20 mg total) daily by mouth. 30 tablet 0  . traZODone (DESYREL) 50 MG tablet Take 1 tablet (50 mg total) at bedtime as needed by mouth for sleep. 30 tablet 0   No current facility-administered medications for this visit.     Neurologic: Headache: Negative Seizure: Negative Paresthesias:Negative  Musculoskeletal: Strength & Muscle Tone: within normal limits Gait & Station: normal Patient leans: N/A  Psychiatric Specialty Exam: ROS  There were no vitals taken for this visit.There is no height or weight on file to calculate BMI.  General Appearance: Well Groomed  Eye Contact:  Good  Speech:  Clear and Coherent  Volume:  Normal  Mood:  Anxious  Affect:  Congruent  Thought Process:  Coherent and Goal Directed  Orientation:  Full (Time, Place, and Person)  Thought Content:  Logical  Suicidal Thoughts:  No  Homicidal Thoughts:  No  Memory:  Immediate;   Good Recent;   Good Remote;   Good  Judgement:  Intact  Insight:  Fair  Psychomotor Activity:  Normal  Concentration:  Concentration: Good and Attention Span: Good  Recall:  Good  Fund of Knowledge:Good  Language: Good  Akathisia:  Negative  Handed:  Right  AIMS (if indicated):  0  Assets:  Communication Skills Desire for Improvement Financial Resources/Insurance Housing Intimacy Leisure Time Physical Health Resilience Social Support Talents/Skills Transportation Vocational/Educational  ADL's:  Intact  Cognition: WNL  Sleep:  adequate    Treatment Plan Summary: Admit to IOP with daily group therapy.  meds appear to be working well  Donnelly Angelica, MD 11/27/201811:51 AM

## 2017-05-11 ENCOUNTER — Other Ambulatory Visit (HOSPITAL_COMMUNITY): Payer: 59 | Admitting: Psychiatry

## 2017-05-11 DIAGNOSIS — F339 Major depressive disorder, recurrent, unspecified: Secondary | ICD-10-CM | POA: Diagnosis not present

## 2017-05-11 DIAGNOSIS — F332 Major depressive disorder, recurrent severe without psychotic features: Secondary | ICD-10-CM

## 2017-05-12 ENCOUNTER — Other Ambulatory Visit (HOSPITAL_COMMUNITY): Payer: 59 | Admitting: Psychiatry

## 2017-05-12 DIAGNOSIS — F332 Major depressive disorder, recurrent severe without psychotic features: Secondary | ICD-10-CM

## 2017-05-12 DIAGNOSIS — F339 Major depressive disorder, recurrent, unspecified: Secondary | ICD-10-CM | POA: Diagnosis not present

## 2017-05-12 NOTE — Progress Notes (Signed)
    Daily Group Progress Note  Program: IOP Group Time: 9:00-12:00   Participation Level: Active   Behavioral Response: Appropriate   Type of Therapy:  Group Therapy   Summary of Progress: Pt presented as engaged.  Pt said he had some anxiety around seeing that his tires had low air pressure this morning, because he was planning to see his daughter after group.  Pt expressed that he believes suicide is a sin and winced when counselor used the term "suicide".  Counselor affirmed pt's fear and expressed that suicide is a sensitive topic that will likely come up in group, as many members have dealt with it in different ways.  Pt shared that his religious beliefs are very important to him.  Pt listened to and engaged in presentation about exercise, nutrition and sleep from [place], [employer].  Nancie Neas, LPC

## 2017-05-12 NOTE — Progress Notes (Signed)
    Daily Group Progress Note  Program: IOP  Group Time: 9:00-12:00  Participation Level: Active  Behavioral Response: Appropriate  Type of Therapy:  Group Therapy  Summary of Progress: Pt. Presented as less agitated than in previous group. Pt. Provided thoughtful feedback to other group member regarding seeking an accountability partner so that she can develop trust for herself. Pt. Discussed developing interest in food and cooking for himself and his wife. Pt. Participated in grief and loss group with the Chaplain. Pt. Participated in mindfulness meditation and breathing exercise.      Nancie Neas, LPC

## 2017-05-12 NOTE — Progress Notes (Signed)
    Daily Group Progress Note  Program: IOP  Group Time: 9:00-12:00   Participation Level: Active   Behavioral Response: Appropriate   Type of Therapy:  Group Therapy   Summary of Progress: Pt presented as engaged.  Pt said that he is doing great.  Pt participated in cognitive modeling activity, identifying his circumstance as a traffic jam that he ran in on the way to group.  The thought he associated with this is that his day will be shot and that he might be late, which could make others angry and disappointed.  The feelings that come with this thought are frustration and discouragement and the result is anxiety and having a bad day.  Nancie Neas, LPC

## 2017-05-13 ENCOUNTER — Other Ambulatory Visit (HOSPITAL_COMMUNITY): Payer: 59 | Admitting: Psychiatry

## 2017-05-13 DIAGNOSIS — F332 Major depressive disorder, recurrent severe without psychotic features: Secondary | ICD-10-CM

## 2017-05-13 DIAGNOSIS — F339 Major depressive disorder, recurrent, unspecified: Secondary | ICD-10-CM | POA: Diagnosis not present

## 2017-05-16 ENCOUNTER — Other Ambulatory Visit (HOSPITAL_COMMUNITY): Payer: 59 | Attending: Psychiatry | Admitting: Psychiatry

## 2017-05-16 DIAGNOSIS — F339 Major depressive disorder, recurrent, unspecified: Secondary | ICD-10-CM | POA: Diagnosis present

## 2017-05-16 DIAGNOSIS — F332 Major depressive disorder, recurrent severe without psychotic features: Secondary | ICD-10-CM | POA: Insufficient documentation

## 2017-05-17 ENCOUNTER — Other Ambulatory Visit (HOSPITAL_COMMUNITY): Payer: 59

## 2017-05-17 NOTE — Progress Notes (Signed)
    Daily Group Progress Note  Program: IOP  Group Time: 9:00-12:00   Participation Level: Active   Behavioral Response: Appropriate   Type of Therapy:  Group Therapy   Summary of Progress: Pt presented as engaged.  Counselors provided information about yoga and mindfulness practices and self-care strategies.  Pt chose getting a hot dog from [place] as his self-care goal for the weekend.  Pt said he also planned to make dinner for his wife.  Nancie Neas, LPC

## 2017-05-18 ENCOUNTER — Other Ambulatory Visit (HOSPITAL_COMMUNITY): Payer: 59 | Admitting: Psychiatry

## 2017-05-18 DIAGNOSIS — F332 Major depressive disorder, recurrent severe without psychotic features: Secondary | ICD-10-CM | POA: Diagnosis not present

## 2017-05-18 DIAGNOSIS — H1013 Acute atopic conjunctivitis, bilateral: Secondary | ICD-10-CM

## 2017-05-18 NOTE — Progress Notes (Signed)
    Daily Group Progress Note  Program: IOP Group Time: 9:00-12:00   Participation Level: Active   Behavioral Response: Appropriate   Type of Therapy:  Group Therapy   Summary of Progress: Pt presented as engaged.  Pt listened to and engaged in discussion with pharmacist about medications.  Pt inquired about self-compassion and said it is something he may need to work on.  Pt named feeling good, his faith and positivity with his wife as things he would like to cultivate moving forward and self-judgement, doubt and anxiety/depression as things he would like to move away from.   Nancie Neas, LPC

## 2017-05-19 ENCOUNTER — Other Ambulatory Visit (HOSPITAL_COMMUNITY): Payer: 59 | Admitting: Psychiatry

## 2017-05-19 DIAGNOSIS — F332 Major depressive disorder, recurrent severe without psychotic features: Secondary | ICD-10-CM

## 2017-05-19 NOTE — Progress Notes (Signed)
    Daily Group Progress Note  Program: IOP   Group Time: 9:00-12:00  Participation Level: Active  Behavioral Response: Appropriate  Type of Therapy:  Group Therapy  Summary of Progress:  Pt. Presented with brightened affect, smiled and talked appropriately, engaged in the group process. When asked by the counselor how he was doing today, pt. Responded "blessed and highly favored". Pt. Provided positive feedback to other patients about facing their fears "one step at a time" about returning to work and trying new challenges such as community work. Pt. Participated in breath focused 4-3-8 meditation exercise. Pt. Participated in yoga therapy with Jan Fireman, LPC.     Nancie Neas, LPC

## 2017-05-20 ENCOUNTER — Other Ambulatory Visit (HOSPITAL_COMMUNITY): Payer: 59 | Admitting: Psychiatry

## 2017-05-20 DIAGNOSIS — F332 Major depressive disorder, recurrent severe without psychotic features: Secondary | ICD-10-CM | POA: Diagnosis not present

## 2017-05-20 NOTE — Progress Notes (Signed)
    Daily Group Progress Note  Program: IOP  Group Time: 9:00-12:00  Participation Level: Active  Behavioral Response: Appropriate  Type of Therapy:  Group Therapy  Summary of Progress: Pt. Presented as talkative, engaged in the group process, asks appropriate questions from other group members. Pt. Discussed making mistake yesterday of taking his wife's care and feeling appropriately apologetic and fearful that she would be angry with him. Pt. Discussed that he was judgemental of himself for most of the day and that it was a major challenge to allow himself to let the mistake go. Pt. Requested feedback from the group about how to let mistakes go and allow ourselves to feel self-compassion without feeling selfish. Pt. Participated in discussion about the importance of giving self permission to have feelings of anger and sadness that might have been silenced by culture or childhood. Pt. Participated in discussion about money management facilitated by speaker from the consumer credit counseling agency.      Nancie Neas, LPC

## 2017-05-20 NOTE — Progress Notes (Signed)
    Daily Group Progress Note  Program: IOP  Group Time: 9:00-12:00   Participation Level: Active   Behavioral Response: Appropriate   Type of Therapy:  Group Therapy   Summary of Progress: Pt presented as engaged.  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  Pt listened to and engaged in guest presentation about essentials oils and mental health.  Nancie Neas, LPC

## 2017-05-23 ENCOUNTER — Other Ambulatory Visit (HOSPITAL_COMMUNITY): Payer: 59

## 2017-05-24 ENCOUNTER — Other Ambulatory Visit (HOSPITAL_COMMUNITY): Payer: 59

## 2017-05-25 ENCOUNTER — Other Ambulatory Visit (HOSPITAL_COMMUNITY): Payer: 59 | Admitting: Psychiatry

## 2017-05-25 DIAGNOSIS — F332 Major depressive disorder, recurrent severe without psychotic features: Secondary | ICD-10-CM | POA: Diagnosis not present

## 2017-05-25 NOTE — Progress Notes (Signed)
    Daily Group Progress Note  Program: IOP  Group Time: 9:00-12:00  Participation Level: Active  Behavioral Response: Appropriate  Type of Therapy:  Group Therapy  Summary of Progress: Pt. Continues to present as alert, bright affect, talkative, engaged in the group process. Pt. Continues to report intense hunger which he attributes to his medication. Pt. Reports that he feels more relaxed and that his mood is good, that he is engaging well with his wife, and that he is sleeping well. Pt. Continues to be supportive of other group members. Pt. Participated in discussion about the pressures of people pleasing and the importance of power posing and the use of power posing during assertive communication.       Nancie Neas, LPC

## 2017-05-26 ENCOUNTER — Other Ambulatory Visit (HOSPITAL_COMMUNITY): Payer: 59 | Admitting: Psychiatry

## 2017-05-26 DIAGNOSIS — F332 Major depressive disorder, recurrent severe without psychotic features: Secondary | ICD-10-CM

## 2017-05-27 ENCOUNTER — Other Ambulatory Visit (HOSPITAL_COMMUNITY): Payer: 59 | Admitting: Psychiatry

## 2017-05-27 DIAGNOSIS — F332 Major depressive disorder, recurrent severe without psychotic features: Secondary | ICD-10-CM | POA: Diagnosis not present

## 2017-05-27 NOTE — Progress Notes (Signed)
    Daily Group Progress Note  Program: IOP  Group Time: 9:00-12:00  Participation Level: Active  Behavioral Response: Appropriate  Type of Therapy:  Group Therapy  Summary of Progress: Pt. Continues to present as active, talkative, engaged in the group process. Pt. continues to report that his appetite is good, he is participating well in his personal relationships, and that his depression is much improved compared to prior to coming to IOP program. Pt. Reports that he is sleeping well. Pt. Provides generally supportive feedback to other group members. Pt. Participated in discussion about use of cognitive modeling to identify faulty cognitions, difficult emotions, and problematic behavioral patterns. Pt. Met with case manager and psychiatrist for intake. Pt. Participated in discussion with mental health association about use of community resources to support mental health.     Nancie Neas, LPC

## 2017-05-27 NOTE — Progress Notes (Signed)
    Daily Group Progress Note  Program: IOP  Group Time: 9:00-12:00  Participation Level: Active  Behavioral Response: Appropriate  Type of Therapy:  Group Therapy  Summary of Progress: Pt. Presents as talkative, engaged in the group process. Pt. Discussed stressful evening trying to help his wife with professional applications on the computer and needing technical assistance and resulting in an argument with his wife and coping by stress eating doughnuts. Pt. Participated in discussion facilitated by the [facility] about managing stress and reducing crime during the holidays.     Nancie Neas, LPC

## 2017-05-30 ENCOUNTER — Other Ambulatory Visit (HOSPITAL_COMMUNITY): Payer: 59

## 2017-05-31 ENCOUNTER — Other Ambulatory Visit (HOSPITAL_COMMUNITY): Payer: 59 | Admitting: Psychiatry

## 2017-05-31 DIAGNOSIS — F332 Major depressive disorder, recurrent severe without psychotic features: Secondary | ICD-10-CM

## 2017-05-31 NOTE — Progress Notes (Signed)
Maurice Duffy is a 66 y.o. , married, Caucasian male who was transitioned from the inpatient psychiatric unit at [facility].  Pt was admitted there from 04-25-17 through 05-02-17; treatment for worsening depressive and anxiety symptoms with SI.  States he didn't have a plan or intent.  Currently denies any SI.  Discussed safety options with pt at length.  Pt also denies HI or A/V hallucinations.  Triggers/Stressors:  1)  Recent hearing of Kavanaugh and other political issues.  "I just got caught up in all of it.  I didn't know him or anything, but I was watching the hearing."  2)  Caregiver:  Wife of 26 yrs marriage has Arthritis and he assists her.  She is also a Marine scientist and works for [employer] from home.  3)  Daughter:  Pt states he worries about his 38 yr old daughter's financial situation.  Her husband is currently in [place] and they are having a financial strain.  "I am feeling better, but I still feel a little fragile."  Pt has a new pt upcoming appt with [name] on 05-18-17.  Reports no other prior psychiatric admits besides the recent one at [facility].  Saw psychiatrist in 1993 and that's when he was dx'd with major depression.  States he was started on medications, but stopped them ~ two yrs ago; due to cost.  Denies any prior suicide attempts or gestures.  Family hx:  Deceased father (anxiety).  Pt completed MH-IOP today.  States that the groups were helpful.  Reports his plan is to return to work tomorrow.  "I am concerned with leaving my wife at home alone."  According to pt, the company has increased his hours by three and will have him driving to [employer] store to work.  Pt states he is anxious about driving back and forth in the traffic.  Scored 5 on the burns upon admission, today scored 6.  A:  D/C today.  F/U with [name] in March 2019.  Pt states he will call [facility] to schedule f/u appt with his therapist.  Encouraged support groups.  RTW, without any restrictions tomorrow.  R:   Pt receptive.                                                                     Carlis Abbott, RITA, M.Ed,CNA

## 2017-05-31 NOTE — Patient Instructions (Signed)
D:  Patient completed MH-IOP today.  A:  Will follow up with [name] in March 2019.  Patient will call therapist at [facility] for a follow up appointment.  Return to work tomorrow without any restrictions.  R:  Patient receptive.

## 2017-05-31 NOTE — Progress Notes (Signed)
    Daily Group Progress Note  Program: IOP  Group Time: 9:00-12:00  Participation Level: Active  Behavioral Response: Appropriate  Type of Therapy:  Group Therapy  Summary of Progress: Pt. Met with the case manager and the psychiatrist in preparation for discharge. Pt. Reported that he continues to do well and that his depression was much improved since starting the IOP program. Pt. Discussed that he had formed some good relationships and he hoped he would continue positive socialization outside of IOP. Pt. Received positive feedback from the counselor and the other group members. Pt. Participated in group discussion about the importance of developing healthy relationship boundaries. Pt. Participated in grief and loss group with the Chaplain.      Brown, Jennifer B, LPC 

## 2017-05-31 NOTE — Progress Notes (Signed)
Patient ID: Maurice Duffy, male   DOB: 1950/08/06, 66 y.o.   MRN: [id number]  [facility] IOP DISCHARGE NOTE  Patient:  Maurice Duffy DOB:  09-14-1950  Date of Admission:05/10/2017   Date of Discharge: 05/31/2017  Reason for Admission:depression post inpatient stay  IOP Course:attended and participated.  Says he feels more secure in being released to outpatient.  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

## 2017-06-01 ENCOUNTER — Other Ambulatory Visit (HOSPITAL_COMMUNITY): Payer: 59

## 2017-06-02 ENCOUNTER — Other Ambulatory Visit (HOSPITAL_COMMUNITY): Payer: 59

## 2017-06-03 ENCOUNTER — Other Ambulatory Visit (HOSPITAL_COMMUNITY): Payer: 59

## 2017-06-06 ENCOUNTER — Other Ambulatory Visit (HOSPITAL_COMMUNITY): Payer: 59

## 2017-06-08 ENCOUNTER — Other Ambulatory Visit (HOSPITAL_COMMUNITY): Payer: 59

## 2017-06-09 ENCOUNTER — Other Ambulatory Visit (HOSPITAL_COMMUNITY): Payer: 59

## 2017-06-10 ENCOUNTER — Other Ambulatory Visit (HOSPITAL_COMMUNITY): Payer: 59

## 2017-06-13 ENCOUNTER — Other Ambulatory Visit (HOSPITAL_COMMUNITY): Payer: 59

## 2017-06-15 ENCOUNTER — Other Ambulatory Visit (HOSPITAL_COMMUNITY): Payer: 59

## 2017-06-16 ENCOUNTER — Other Ambulatory Visit (HOSPITAL_COMMUNITY): Payer: 59

## 2017-06-17 ENCOUNTER — Other Ambulatory Visit (HOSPITAL_COMMUNITY): Payer: 59

## 2017-06-20 ENCOUNTER — Other Ambulatory Visit (HOSPITAL_COMMUNITY): Payer: 59

## 2017-06-21 ENCOUNTER — Other Ambulatory Visit (HOSPITAL_COMMUNITY): Payer: 59

## 2017-06-22 ENCOUNTER — Other Ambulatory Visit (HOSPITAL_COMMUNITY): Payer: 59

## 2017-06-23 ENCOUNTER — Other Ambulatory Visit (HOSPITAL_COMMUNITY): Payer: 59

## 2017-06-23 MED ORDER — CITALOPRAM HYDROBROMIDE 20 MG PO TABS: 20.0000 mg | ORAL_TABLET | Freq: Every day | ORAL | 1 refills | Status: AC

## 2017-06-23 MED ORDER — BUSPIRONE HCL 15 MG PO TABS: 15.0000 mg | ORAL_TABLET | Freq: Three times a day (TID) | ORAL | 2 refills | Status: AC

## 2017-06-24 ENCOUNTER — Other Ambulatory Visit (HOSPITAL_COMMUNITY): Payer: 59

## 2017-06-27 ENCOUNTER — Other Ambulatory Visit (HOSPITAL_COMMUNITY): Payer: 59

## 2017-06-28 ENCOUNTER — Other Ambulatory Visit (HOSPITAL_COMMUNITY): Payer: 59

## 2017-06-29 ENCOUNTER — Other Ambulatory Visit (HOSPITAL_COMMUNITY): Payer: 59

## 2017-06-30 ENCOUNTER — Other Ambulatory Visit (HOSPITAL_COMMUNITY): Payer: 59

## 2017-07-01 ENCOUNTER — Other Ambulatory Visit (HOSPITAL_COMMUNITY): Payer: 59

## 2017-07-04 ENCOUNTER — Other Ambulatory Visit (HOSPITAL_COMMUNITY): Payer: 59

## 2017-09-15 DIAGNOSIS — M791 Myalgia, unspecified site: Secondary | ICD-10-CM | POA: Diagnosis not present

## 2017-09-15 DIAGNOSIS — E782 Mixed hyperlipidemia: Secondary | ICD-10-CM | POA: Diagnosis not present

## 2017-09-15 DIAGNOSIS — G5601 Carpal tunnel syndrome, right upper limb: Secondary | ICD-10-CM | POA: Diagnosis not present

## 2017-09-15 DIAGNOSIS — R531 Weakness: Secondary | ICD-10-CM | POA: Diagnosis not present

## 2017-10-03 DIAGNOSIS — M791 Myalgia, unspecified site: Secondary | ICD-10-CM | POA: Diagnosis not present

## 2017-10-07 DIAGNOSIS — I2510 Atherosclerotic heart disease of native coronary artery without angina pectoris: Secondary | ICD-10-CM | POA: Diagnosis not present

## 2017-10-07 DIAGNOSIS — E782 Mixed hyperlipidemia: Secondary | ICD-10-CM | POA: Diagnosis not present

## 2017-10-07 DIAGNOSIS — R748 Abnormal levels of other serum enzymes: Secondary | ICD-10-CM | POA: Diagnosis not present

## 2017-11-29 DIAGNOSIS — F411 Generalized anxiety disorder: Secondary | ICD-10-CM | POA: Diagnosis not present

## 2017-11-29 DIAGNOSIS — R69 Illness, unspecified: Secondary | ICD-10-CM | POA: Diagnosis not present

## 2018-02-27 DIAGNOSIS — R69 Illness, unspecified: Secondary | ICD-10-CM | POA: Diagnosis not present

## 2018-02-27 DIAGNOSIS — F411 Generalized anxiety disorder: Secondary | ICD-10-CM | POA: Diagnosis not present

## 2018-05-29 DIAGNOSIS — F411 Generalized anxiety disorder: Secondary | ICD-10-CM | POA: Diagnosis not present

## 2018-05-29 DIAGNOSIS — R69 Illness, unspecified: Secondary | ICD-10-CM | POA: Diagnosis not present

## 2018-06-20 DIAGNOSIS — Z125 Encounter for screening for malignant neoplasm of prostate: Secondary | ICD-10-CM | POA: Diagnosis not present

## 2018-06-20 DIAGNOSIS — E782 Mixed hyperlipidemia: Secondary | ICD-10-CM | POA: Diagnosis not present

## 2018-06-20 DIAGNOSIS — Z79899 Other long term (current) drug therapy: Secondary | ICD-10-CM | POA: Diagnosis not present

## 2018-06-20 DIAGNOSIS — I2510 Atherosclerotic heart disease of native coronary artery without angina pectoris: Secondary | ICD-10-CM | POA: Diagnosis not present

## 2018-06-26 DIAGNOSIS — Z0000 Encounter for general adult medical examination without abnormal findings: Secondary | ICD-10-CM | POA: Diagnosis not present

## 2018-06-26 DIAGNOSIS — I2510 Atherosclerotic heart disease of native coronary artery without angina pectoris: Secondary | ICD-10-CM | POA: Diagnosis not present

## 2018-06-26 DIAGNOSIS — E782 Mixed hyperlipidemia: Secondary | ICD-10-CM | POA: Diagnosis not present

## 2018-06-26 DIAGNOSIS — R69 Illness, unspecified: Secondary | ICD-10-CM | POA: Diagnosis not present

## 2018-08-23 DIAGNOSIS — R69 Illness, unspecified: Secondary | ICD-10-CM | POA: Diagnosis not present

## 2018-08-23 DIAGNOSIS — F411 Generalized anxiety disorder: Secondary | ICD-10-CM | POA: Diagnosis not present

## 2018-11-15 DIAGNOSIS — F411 Generalized anxiety disorder: Secondary | ICD-10-CM | POA: Diagnosis not present

## 2018-11-15 DIAGNOSIS — R69 Illness, unspecified: Secondary | ICD-10-CM | POA: Diagnosis not present

## 2019-02-12 DIAGNOSIS — F411 Generalized anxiety disorder: Secondary | ICD-10-CM | POA: Diagnosis not present

## 2019-02-12 DIAGNOSIS — R69 Illness, unspecified: Secondary | ICD-10-CM | POA: Diagnosis not present

## 2019-02-21 ENCOUNTER — Ambulatory Visit (INDEPENDENT_AMBULATORY_CARE_PROVIDER_SITE_OTHER): Payer: Medicare HMO | Admitting: Cardiovascular Disease

## 2019-02-21 ENCOUNTER — Encounter: Payer: Self-pay | Admitting: Cardiovascular Disease

## 2019-02-21 ENCOUNTER — Other Ambulatory Visit: Payer: Self-pay

## 2019-02-21 VITALS — BP 124/68 | HR 49 | Ht 69.0 in | Wt 179.0 lb

## 2019-02-21 DIAGNOSIS — I2510 Atherosclerotic heart disease of native coronary artery without angina pectoris: Secondary | ICD-10-CM

## 2019-02-21 DIAGNOSIS — I2583 Coronary atherosclerosis due to lipid rich plaque: Secondary | ICD-10-CM | POA: Diagnosis not present

## 2019-02-21 DIAGNOSIS — Z008 Encounter for other general examination: Secondary | ICD-10-CM

## 2019-02-21 DIAGNOSIS — E782 Mixed hyperlipidemia: Secondary | ICD-10-CM

## 2019-02-21 NOTE — Patient Instructions (Signed)
Medication Instructions:  Your physician recommends that you continue on your current medications as directed. Please refer to the Current Medication list given to you today.  If you need a refill on your cardiac medications before your next appointment, please call your pharmacy.   Lab work: Your physician recommends that you return for lab work TODAY: FASTING LIPID AND LIVER PANELS  If you have labs (blood work) drawn today and your tests are completely normal, you will receive your results only by: Marland Kitchen MyChart Message (if you have MyChart) OR . A paper copy in the mail If you have any lab test that is abnormal or we need to change your treatment, we will call you to review the results.  Testing/Procedures: NONE  Follow-Up: At [facility], you and your health needs are our priority.  As part of our continuing mission to provide you with exceptional heart care, we have created designated Provider Care Teams.  These Care Teams include your primary Cardiologist (physician) and Advanced Practice Providers (APPs -  Physician Assistants and Nurse Practitioners) who all work together to provide you with the care you need, when you need it. You will need a follow up appointment in 12 months with Dr. Quay Burow.  Please call our office 2 months in advance to schedule this/each appointment.

## 2019-02-21 NOTE — Assessment & Plan Note (Signed)
History of CAD status post mid LAD PCI and stenting by myself using Taxus drug-eluting stent May 2002.  I restudied him several weeks later because of chest pain and found a widely patent stent.  His last Myoview performed in 2013 was nonischemic.  He denies chest pain or shortness of breath.

## 2019-02-21 NOTE — Progress Notes (Signed)
02/21/2019 Maurice Duffy   1950-06-26  [id number]  Primary Physician Merrilee Seashore, MD Primary Cardiologist: Lorretta Harp MD Garret Reddish, [place], [place]  HPI:  Maurice Duffy is a 68 y.o.  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 He has a history of dyslipidemia and discontinued tobacco abuse. He has known CAD status post mid LAD PCI and stenting by myself using a Taxus drug-eluting stent in May 2002. I restudied him several weeks later because of chest pain and found this to be widely patent. He also has GERD. He is a very anxious gentleman with manic depressive disorder. He had a negative Myoview in 2013.  Since I saw him 4 years ago he is remained stable.  He no longer is on a statin drug because he claims to be statin intolerant.  He denies chest pain or shortness of breath.  Current Meds  Medication Sig  . azelastine (OPTIVAR) 0.05 % ophthalmic solution Place 1 drop into both eyes 2 (two) times daily.  . busPIRone (BUSPAR) 15 MG tablet Take 1 tablet (15 mg total) by mouth 3 (three) times daily.  . traZODone (DESYREL) 50 MG tablet Take 1 tablet (50 mg total) at bedtime as needed by mouth for sleep.     No Known Allergies  Social History   Socioeconomic History  . Marital status: Married    Spouse name: Not on file  . Number of children: Not on file  . Years of education: Not on file  . Highest education level: Not on file  Occupational History  . 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 Financial resource strain: Not on file  . Food insecurity    Worry: Not on file    Inability: Not on file  . Transportation needs    Medical: Not on file    Non-medical: Not on file  Tobacco Use  . Smoking status: Former Smoker    Packs/day: 1.00    Years: 18.00    Pack years: 18.00    Quit date: 08/13/2011    Years since quitting: 7.5  .  Smokeless tobacco: Never Used  Substance and Sexual Activity  . Alcohol use: No  . Drug use: No  . Sexual activity: Yes  Lifestyle  . Physical activity    Days per week: Not on file    Minutes per session: Not on file  . Stress: Not on file  Relationships  . Social Herbalist on phone: Not on file    Gets together: Not on file    Attends religious service: Not on file    Active member of club or organization: Not on file    Attends meetings of clubs or organizations: Not on file    Relationship status: Not on file  . Intimate partner violence    Fear of current or ex partner: Not on file    Emotionally abused: Not on file    Physically abused: Not on file    Forced sexual activity: Not on file  Other Topics Concern  . Not on file  Social History Narrative   Married. Education: [employer].     Review of Systems: General: negative for chills, fever, night sweats or weight changes.  Cardiovascular: negative for chest pain, dyspnea on exertion, edema, orthopnea, palpitations, paroxysmal nocturnal dyspnea or shortness of  breath Dermatological: negative for rash Respiratory: negative for cough or wheezing Urologic: negative for hematuria Abdominal: negative for nausea, vomiting, diarrhea, bright red blood per rectum, melena, or hematemesis Neurologic: negative for visual changes, syncope, or dizziness All other systems reviewed and are otherwise negative except as noted above.    Blood pressure 124/68, pulse (!) 49, height 5\' 9"  (1.753 m), weight 179 lb (81.2 kg).  General appearance: alert and no distress Neck: no adenopathy, no carotid bruit, no JVD, supple, symmetrical, trachea midline and thyroid not enlarged, symmetric, no tenderness/mass/nodules Lungs: clear to auscultation bilaterally Heart: regular rate and rhythm, S1, S2 normal, no murmur, click, rub or gallop Extremities: extremities normal, atraumatic, no cyanosis or edema Pulses: 2+ and symmetric Skin:  Skin color, texture, turgor normal. No rashes or lesions Neurologic: Alert and oriented X 3, normal strength and tone. Normal symmetric reflexes. Normal coordination and gait  EKG sinus bradycardia at 49 with left axis deviation.  I personally reviewed this EKG.  ASSESSMENT AND PLAN:   CAD (coronary artery disease) History of CAD status post mid LAD PCI and stenting by myself using Taxus drug-eluting stent May 2002.  I restudied him several weeks later because of chest pain and found a widely patent stent.  His last Myoview performed in 2013 was nonischemic.  He denies chest pain or shortness of breath.  Hyperlipidemia History of hyperlipidemia not on statin therapy because of statin intolerance.  We will recheck a lipid liver profile today      Lorretta Harp MD [facility] 02/21/2019 12:16 PM

## 2019-02-21 NOTE — Assessment & Plan Note (Signed)
History of hyperlipidemia not on statin therapy because of statin intolerance.  We will recheck a lipid liver profile today

## 2019-02-22 LAB — HEPATIC FUNCTION PANEL
ALT: 41 IU/L (ref 0–44)
AST: 36 IU/L (ref 0–40)
Albumin: 4.3 g/dL (ref 3.8–4.8)
Alkaline Phosphatase: 80 IU/L (ref 39–117)
Bilirubin Total: 0.6 mg/dL (ref 0.0–1.2)
Bilirubin, Direct: 0.15 mg/dL (ref 0.00–0.40)
Total Protein: 5.9 g/dL — ABNORMAL LOW (ref 6.0–8.5)

## 2019-02-22 LAB — LIPID PANEL
Chol/HDL Ratio: 2.5 ratio (ref 0.0–5.0)
Cholesterol, Total: 149 mg/dL (ref 100–199)
HDL: 59 mg/dL (ref >39)
LDL Chol Calc (NIH): 76 mg/dL (ref 0–99)
Triglycerides: 74 mg/dL (ref 0–149)
VLDL Cholesterol Cal: 14 mg/dL (ref 5–40)

## 2019-04-10 ENCOUNTER — Other Ambulatory Visit: Payer: Self-pay

## 2019-04-10 ENCOUNTER — Ambulatory Visit (INDEPENDENT_AMBULATORY_CARE_PROVIDER_SITE_OTHER): Payer: Medicare HMO | Admitting: Psychiatry

## 2019-04-10 DIAGNOSIS — R69 Illness, unspecified: Secondary | ICD-10-CM

## 2019-04-10 DIAGNOSIS — F411 Generalized anxiety disorder: Secondary | ICD-10-CM

## 2019-04-10 DIAGNOSIS — F3181 Bipolar II disorder: Secondary | ICD-10-CM
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
## 2019-04-25 ENCOUNTER — Other Ambulatory Visit: Payer: Self-pay

## 2019-04-25 ENCOUNTER — Ambulatory Visit (INDEPENDENT_AMBULATORY_CARE_PROVIDER_SITE_OTHER): Payer: Medicare HMO | Admitting: Psychiatry

## 2019-04-25 DIAGNOSIS — F411 Generalized anxiety disorder: Secondary | ICD-10-CM | POA: Diagnosis not present

## 2019-04-25 DIAGNOSIS — Z630 Problems in relationship with spouse or partner: Secondary | ICD-10-CM

## 2019-04-25 DIAGNOSIS — F3181 Bipolar II disorder: Secondary | ICD-10-CM

## 2019-04-25 DIAGNOSIS — R69 Illness, unspecified: Secondary | ICD-10-CM | POA: Diagnosis not present

## 2019-04-25 NOTE — Progress Notes (Signed)
Psychotherapy Progress Note [facility] Maurice Moore, PhD LP  Patient ID: Maurice Duffy     MRN: [id number]     Therapy format: Individual psychotherapy Date: 04/25/2019     Start: 9:14a Stop: 9:53a Time Spent: 39 min Location: in-person   Session narrative (presenting needs, interim history, self-report of stressors and symptoms, applications of prior therapy, status changes, and interventions made in session) More relaxed since election over, glad to have negative ads and disgust down.  No real goals at this point.  Discussed wife's wishes -- not clear to him, feels like she gets irritable about a lot of little things in the household.  Feels Maurice Duffy takes out her own stresses on him by nitpicking and calling him out, while still explaining him as gluten-affected.  Own tendency to either shut down and take it or eventually pick up the keys and walk out.  Context on wife -- child survivor of father's infidelity, watched her mother suffer, was for an earlier time in their relationship "insanely jealous" but released that with time and aging.  She also has chronic pain from osteoarthritis, which he knows affects her outlook and distress tolerance.  Intimacy has waned over the years.  Framed more constructive options for addressing irritating communication, including "When you ___, I want to ___" and "Maurice Duffy, nitpicking] -- is it working, honey?"  And "What do you need right now?"  As needed, "I can't hear you for the yelling.  Would you .Marland Kitchen.?"  Oriented how the usual problem in chronic marital conflict is the communication habits they get into, not so much one "good guy" and one "bad guy".  Discussed possibility of including Maurice Duffy. At least for a session, to better understand her needs and wishes and to open up opportunity to change dynamics.   In his case, breaking down the cycle of bottling and blowing and finding his own 3rd way to respond constructively to irritating  interactions.  Says W c/o him forgetting things, can't really elaborate.    Confesses he is sleepy today, attributes to dark weather.  Been in habit of staying up for late show monologues.  Did obtain amber lenses and has been using them the last hour of the evening, confirms they do help him feel more ready to sleep.  Discussed option to start earlier, if he needs more restful and solid sleep.  Habit of sleeping separately for about a year -- he likes a fan, likes the room cooler, so he sleeps on the couch.  Bodily he's restless, also, which interferes with Maurice Duffy's rest.  Endorsed continuing the sleeping pattern he has as long as it is comfortable enough to rest, note if there seem to be any other disturbing factors for sleep, like noise, light, uncomfortable surface.   In view of PT's dfx sustaining attention and finding agenda, called the session early so he could get hone and rest.  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     Behavior:  appropriate, fidgety  Motor:  Restlestness  Speech/Language:   generally clear, a little rapid and soft tones, border on muttering in places  Affect:  restless, low eye contact, more side glances  Mood:  sleepy; denies depression, anxiety, or irritability at present  Thought process:  somewhat impressionistic but generally on point, goal-directed  Thought content:    WNL  Sensory/Perceptual disturbances:  WNL and no signs of hallucination  Orientation:  grossly intact  Attention:  Good  Concentration:  Good  Memory:  grossly intact  Insight:    Fair  Judgment:   Fair  Impulse Control:  Fair   Risk Assessment: Danger to Self: No Self-injurious Behavior: No Danger to Others: No Physical Aggression / Violence: No Duty to Warn: No Access to Firearms a concern:  No  Assessment of progress:  progressing  Diagnosis:   ICD-10-CM   1. Bipolar 2 disorder (HCC)  F31.81   2. GAD (generalized anxiety disorder)  F41.1   3. Relationship problem between partners  Z63.0     Plan:  . Continue amber lenses, assess other possibilities for sleep interference . Try out 3rd-way, process-oriented responses to criticism, state it explicitly if taking a time out to cool off . Other recommendations/advice as noted above . Continue to utilize previously learned skills ad lib . Maintain medication as prescribed and work faithfully with relevant prescriber(s) if any changes are desired or seem indicated . Call the clinic on-call service, present to ER, or call 911 if any life-threatening psychiatric crisis Return in about 2 weeks (around 05/09/2019) for invite significant other, will call.  Maurice Serve, PhD Maurice Moore, PhD LP Clinical Psychologist, [facility] [address], [place], [place] [address] [phone]

## 2019-05-09 DIAGNOSIS — R69 Illness, unspecified: Secondary | ICD-10-CM | POA: Diagnosis not present

## 2019-05-09 DIAGNOSIS — F411 Generalized anxiety disorder: Secondary | ICD-10-CM | POA: Diagnosis not present

## 2019-05-12 DIAGNOSIS — R69 Illness, unspecified: Secondary | ICD-10-CM | POA: Diagnosis not present

## 2019-05-25 ENCOUNTER — Ambulatory Visit (INDEPENDENT_AMBULATORY_CARE_PROVIDER_SITE_OTHER): Payer: Medicare HMO | Admitting: Psychiatry

## 2019-05-25 DIAGNOSIS — Z630 Problems in relationship with spouse or partner: Secondary | ICD-10-CM | POA: Diagnosis not present

## 2019-05-25 DIAGNOSIS — F3181 Bipolar II disorder: Secondary | ICD-10-CM

## 2019-05-25 DIAGNOSIS — R69 Illness, unspecified: Secondary | ICD-10-CM

## 2019-05-25 DIAGNOSIS — F411 Generalized anxiety disorder: Secondary | ICD-10-CM | POA: Diagnosis not present

## 2019-05-25 NOTE — Progress Notes (Signed)
Psychotherapy Progress Note [facility] Luan Moore, PhD LP  Patient ID: Maurice Duffy     MRN: [id number]     Therapy format: Family therapy w/ patient -- accompanied by wife [name] Date: 05/25/2019      Start: 3:04p     Stop: 3:54p     Time Spent: 50 min Location: Telehealth visit -- I connected with this patient by an approved telecommunication method (audio only), with his informed consent, and verifying identity and patient privacy.  I was located at my office and patient at his home.  As needed, we discussed the limitations, risks, and security and privacy concerns associated with telehealth service, including the availability and conditions which currently govern in-person appointments and the possibility that 3rd-party payment may not be fully guaranteed and he may be responsible for charges.  After he indicated understanding, we proceeded with the session.  Also discussed treatment planning, as needed, including ongoing verbal agreement with the plan, the opportunity to ask and answer all questions, his demonstrated understanding of instructions, and his readiness to call the office should symptoms worsen or he feels he is in a crisis state and needs more immediate and tangible assistance.   Session narrative (presenting needs, interim history, self-report of stressors and symptoms, applications of prior therapy, status changes, and interventions made in session) Wife [name] joins the call, by consent and invitation of PT.  Says PT gets angry over simple things, excessive, frequently.  Wants to know that he is working on it and any tools available to him.  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    [name] acknowledges she has RA herself and really needs peace to help her condition.  Has had a good  bit of counseling herself, grew up in alcoholic family in [place].  She is oriented to biblical advice to let things go, but knows she can't really press him to do that.  A year and half ago, she was frightened for her safety, but not since.  Credits her faith, learning to respond with quiet, with less of trying to remind him his anger is "wrong".  Is somewhat reassured to see him taking request to get therapy.  Affirmed for W that she not make herself inherently responsible to "get" Pt to calm down or "let go", because this is inherently stressful for her, and her system does need to not be on the hook for that.  Advised W on two alternative responses to his irritability and checked these with him to see that they would be palatable: (1) "Ouch, but what do you need, Lucius?"  (2) "do-overs" for problematic interactions, with the caveat that you call them on yourself, not each other, and you only try them if both parties are open to "re-recording".  Additional issue of how, a couple times a week, Pt interrupts [name] in her work at home, asking seemingly pesky questions.  Advised she set standards for what and when she can be interrupted, use signage if necessary to establish no-approach times, and to try to answer him what she can do and when more than what she can't do and "not now".    Therapeutic modalities: Cognitive Behavioral Therapy, Solution-Oriented/Positive Psychology and Faith-sensitive  Mental Status/Observations:  Appearance:   Not assessed  Behavior:  Appropriate and a bit jumpy  Motor:  Not assessed  Speech/Language:   Clear and Coherent and Pressured  Affect:  Not assessed  Mood:  anxious and possibly hypomanic  Thought process:  normal and redirectable  Thought content:    WNL  Sensory/Perceptual disturbances:    WNL  Orientation:  grossly intact  Attention:  Fair  Concentration:  Fair  Memory:  grossly intact  Insight:    Fair  Judgment:   Fair  Impulse Control:  Fair    Risk Assessment: Danger to Self: No Self-injurious Behavior: No Danger to Others: No Physical Aggression / Violence: No Duty to Warn: No Access to Firearms a concern: No  Assessment of progress:  stabilized  Diagnosis:   ICD-10-CM   1. Bipolar 2 disorder (HCC)  F31.81   2. GAD (generalized anxiety disorder)  F41.1   3. Relationship problem between partners  Z63.0   4. r/o sleep disorder and stimulant use  R69     Plan:  . Use communication strategies above -- affirmative over negative grammar in limit-setting, signage for no-interrupt times, and "What do you need right now?" response to irritability . Practice do-overs as able and useful, constructive time-out when needed . PT keep using blue light control for sleep-readiness, endocrine tone-setting  . Other recommendations/advice as noted above . Option to come in together -- or by video -- next time . Continue to utilize previously learned skills ad lib . Maintain medication as prescribed and work faithfully with relevant prescriber(s) if any changes are desired or seem indicated . Call the clinic on-call service, present to ER, or call 911 if any life-threatening psychiatric crisis Return for time as available. Current [facility] appointments: Future Appointments  Date Time Provider [place]  06/27/2019 11:00 AM Blanchie Serve, PhD CP-CP None    Blanchie Serve, PhD Luan Moore, PhD LP Clinical Psychologist, [facility] [address], [place], [place] [address] [phone]

## 2019-06-27 ENCOUNTER — Ambulatory Visit: Payer: Medicare HMO | Admitting: Psychiatry

## 2019-06-27 ENCOUNTER — Other Ambulatory Visit: Payer: Self-pay

## 2019-06-27 ENCOUNTER — Encounter: Payer: Self-pay | Admitting: Psychiatry

## 2019-06-27 NOTE — Progress Notes (Signed)
Admin note for non-service contact  Patient ID: Maurice Duffy  MRN: [id number] DATE: 06/27/2019  Notified at 19 min after the hour that PT and W unable to keep appt due to [place] running late (overrun from previous appts) and PT having scheduled tightly with appt following this one.  No charge for [facility], RS as able.  Blanchie Serve, PhD Luan Moore, PhD LP Clinical Psychologist, [facility] [address] [place], [place] [address] [phone]

## 2019-08-08 DIAGNOSIS — R69 Illness, unspecified: Secondary | ICD-10-CM | POA: Diagnosis not present

## 2019-08-08 DIAGNOSIS — F411 Generalized anxiety disorder: Secondary | ICD-10-CM | POA: Diagnosis not present

## 2019-10-11 DIAGNOSIS — D1801 Hemangioma of skin and subcutaneous tissue: Secondary | ICD-10-CM | POA: Diagnosis not present

## 2019-10-11 DIAGNOSIS — B351 Tinea unguium: Secondary | ICD-10-CM | POA: Diagnosis not present

## 2019-10-11 DIAGNOSIS — L570 Actinic keratosis: Secondary | ICD-10-CM | POA: Diagnosis not present

## 2019-10-11 DIAGNOSIS — D229 Melanocytic nevi, unspecified: Secondary | ICD-10-CM | POA: Diagnosis not present

## 2019-10-11 DIAGNOSIS — L821 Other seborrheic keratosis: Secondary | ICD-10-CM | POA: Diagnosis not present

## 2019-10-11 DIAGNOSIS — L814 Other melanin hyperpigmentation: Secondary | ICD-10-CM | POA: Diagnosis not present

## 2019-10-11 DIAGNOSIS — L819 Disorder of pigmentation, unspecified: Secondary | ICD-10-CM | POA: Diagnosis not present

## 2019-10-11 DIAGNOSIS — L812 Freckles: Secondary | ICD-10-CM | POA: Diagnosis not present

## 2019-11-01 DIAGNOSIS — R69 Illness, unspecified: Secondary | ICD-10-CM | POA: Diagnosis not present

## 2019-11-01 DIAGNOSIS — F411 Generalized anxiety disorder: Secondary | ICD-10-CM | POA: Diagnosis not present

## 2020-01-08 DIAGNOSIS — N401 Enlarged prostate with lower urinary tract symptoms: Secondary | ICD-10-CM | POA: Diagnosis not present

## 2020-01-08 DIAGNOSIS — N138 Other obstructive and reflux uropathy: Secondary | ICD-10-CM | POA: Diagnosis not present

## 2020-01-08 DIAGNOSIS — I2510 Atherosclerotic heart disease of native coronary artery without angina pectoris: Secondary | ICD-10-CM | POA: Diagnosis not present

## 2020-01-08 DIAGNOSIS — E782 Mixed hyperlipidemia: Secondary | ICD-10-CM | POA: Diagnosis not present

## 2020-01-30 DIAGNOSIS — F411 Generalized anxiety disorder: Secondary | ICD-10-CM | POA: Diagnosis not present

## 2020-01-30 DIAGNOSIS — R69 Illness, unspecified: Secondary | ICD-10-CM | POA: Diagnosis not present

## 2020-04-14 DIAGNOSIS — Z20822 Contact with and (suspected) exposure to covid-19: Secondary | ICD-10-CM | POA: Diagnosis not present

## 2020-04-17 ENCOUNTER — Telehealth: Payer: Self-pay | Admitting: *Deleted

## 2020-04-17 ENCOUNTER — Other Ambulatory Visit (HOSPITAL_COMMUNITY): Payer: Self-pay

## 2020-04-17 ENCOUNTER — Telehealth: Payer: Self-pay | Admitting: Nurse Practitioner

## 2020-04-17 NOTE — Telephone Encounter (Signed)
Called to Discuss with patient about Covid symptoms and the use of the monoclonal antibody infusion for those with mild to moderate Covid symptoms and at a high risk of hospitalization.     Pt appears to qualify for this infusion due to co-morbid conditions and/or a member of an at-risk group in accordance with the FDA Emergency Use Authorization.    Unable to reach pt    

## 2020-04-17 NOTE — Telephone Encounter (Signed)
Pt's wife calling. States upset she has been given conflicting advise regarding referral to [facility]. States left message at clinic at 0530 this AM. States she realizes it may be a while for return call "But I'm trying to keep my husband out of the hospital." States called on this line earlier and was told to get a referral from pts MD. Do not see any documentation regarding call.  MD advised they do not do that.  EMail sent to [facility] with pt information.

## 2020-04-20 ENCOUNTER — Telehealth: Payer: Self-pay | Admitting: Infectious Diseases

## 2020-04-20 ENCOUNTER — Other Ambulatory Visit: Payer: Self-pay | Admitting: Infectious Diseases

## 2020-04-20 DIAGNOSIS — U071 COVID-19: Secondary | ICD-10-CM

## 2020-04-20 NOTE — Progress Notes (Signed)
I connected by phone with Maurice Duffy on 04/20/2020 at 8:33 AM to discuss the potential use of a new treatment for mild to moderate COVID-19 viral infection in non-hospitalized patients.  This patient is a 69 y.o. male that meets the FDA criteria for Emergency Use Authorization of COVID monoclonal antibody casirivimab/imdevimab, bamlanivimab/eteseviamb, or sotrovimab.  Has a (+) direct SARS-CoV-2 viral test result  Has mild or moderate COVID-19   Is NOT hospitalized due to COVID-19  Is within 10 days of symptom onset  Has at least one of the high risk factor(s) for progression to severe COVID-19 and/or hospitalization as defined in EUA.  Specific high risk criteria : Older age (>/= 67 yo)   I have spoken and communicated the following to the patient or parent/caregiver regarding COVID monoclonal antibody treatment:  1. FDA has authorized the emergency use for the treatment of mild to moderate COVID-19 in adults and pediatric patients with positive results of direct SARS-CoV-2 viral testing who are 93 years of age and older weighing at least 40 kg, and who are at high risk for progressing to severe COVID-19 and/or hospitalization.  2. The significant known and potential risks and benefits of COVID monoclonal antibody, and the extent to which such potential risks and benefits are unknown.  3. Information on available alternative treatments and the risks and benefits of those alternatives, including clinical trials.  4. Patients treated with COVID monoclonal antibody should continue to self-isolate and use infection control measures (e.g., wear mask, isolate, social distance, avoid sharing personal items, clean and disinfect "high touch" surfaces, and frequent handwashing) according to CDC guidelines.   5. The patient or parent/caregiver has the option to accept or refuse COVID monoclonal antibody treatment.  After reviewing this information with the patient, the patient has agreed to  receive one of the available covid 19 monoclonal antibodies and will be provided an appropriate fact sheet prior to infusion. Janene Madeira, NP 04/20/2020 8:33 AM

## 2020-04-20 NOTE — Telephone Encounter (Signed)
I connected with Maurice Duffy's wife after she called the hotline back. Maurice Duffy is qualified for treatment with age > 67 yo. Sx started 10/31. Appt set up for Monday AM 11/08 at 11:30.  She has emailed test results to our box.    Maurice Madeira, MSN, NP-C [facility] [email or web address] Pager: [phone] Office: [phone] [place]: [phone]

## 2020-04-21 ENCOUNTER — Ambulatory Visit (HOSPITAL_COMMUNITY)
Admission: RE | Admit: 2020-04-21 | Discharge: 2020-04-21 | Disposition: A | Discharge status: Home / Self Care | Payer: Medicare HMO | Source: Ambulatory Visit | Attending: Pulmonary Disease | Admitting: Pulmonary Disease

## 2020-04-21 DIAGNOSIS — Z23 Encounter for immunization: Secondary | ICD-10-CM | POA: Insufficient documentation

## 2020-04-21 DIAGNOSIS — U071 COVID-19: Secondary | ICD-10-CM

## 2020-04-21 MED ORDER — SOTROVIMAB 500 MG/8ML IV SOLN
500.0000 mg | Freq: Once | INTRAVENOUS | Status: AC
  Administered 2020-04-21: 500 mg via INTRAVENOUS

## 2020-04-21 MED ORDER — SODIUM CHLORIDE 0.9 % IV SOLN: INTRAVENOUS | Status: DC | PRN

## 2020-04-21 MED ORDER — DIPHENHYDRAMINE HCL 50 MG/ML IJ SOLN: 50.0000 mg | Freq: Once | INTRAMUSCULAR | Status: DC | PRN

## 2020-04-21 MED ORDER — ACETAMINOPHEN 325 MG PO TABS
650.0000 mg | ORAL_TABLET | Freq: Four times a day (QID) | ORAL | Status: AC | PRN
  Administered 2020-04-21: 650 mg via ORAL
  Filled 2020-04-21: qty 2

## 2020-04-21 MED ORDER — ALBUTEROL SULFATE HFA 108 (90 BASE) MCG/ACT IN AERS: 2.0000 | INHALATION_SPRAY | Freq: Once | RESPIRATORY_TRACT | Status: DC | PRN

## 2020-04-21 MED ORDER — METHYLPREDNISOLONE SODIUM SUCC 125 MG IJ SOLR: 125.0000 mg | Freq: Once | INTRAMUSCULAR | Status: DC | PRN

## 2020-04-21 MED ORDER — FAMOTIDINE IN NACL 20-0.9 MG/50ML-% IV SOLN: 20.0000 mg | Freq: Once | INTRAVENOUS | Status: DC | PRN

## 2020-04-21 MED ORDER — EPINEPHRINE 0.3 MG/0.3ML IJ SOAJ: 0.3000 mg | Freq: Once | INTRAMUSCULAR | Status: DC | PRN

## 2020-04-21 NOTE — Discharge Instructions (Signed)

## 2020-04-21 NOTE — Progress Notes (Signed)
  Diagnosis: COVID-19  Physician: Dr. Joya Gaskins   Procedure: Sotrovimab infusion- Provided patient with Sotrovimab fact sheet for patients, parents, and caregivers prior to infusion.   Complications: No immediate complications noted.  Discharge: Discharged home   Maurice Duffy 04/21/2020

## 2020-04-25 DIAGNOSIS — Z20822 Contact with and (suspected) exposure to covid-19: Secondary | ICD-10-CM | POA: Diagnosis not present

## 2020-04-29 ENCOUNTER — Encounter: Payer: Self-pay | Admitting: Cardiovascular Disease

## 2020-04-29 ENCOUNTER — Other Ambulatory Visit: Payer: Self-pay

## 2020-04-29 ENCOUNTER — Ambulatory Visit (INDEPENDENT_AMBULATORY_CARE_PROVIDER_SITE_OTHER): Payer: Medicare HMO | Admitting: Cardiovascular Disease

## 2020-04-29 ENCOUNTER — Ambulatory Visit: Payer: Medicare HMO | Admitting: Cardiovascular Disease

## 2020-04-29 VITALS — BP 113/72 | HR 63 | Ht 69.0 in | Wt 178.6 lb

## 2020-04-29 DIAGNOSIS — I2583 Coronary atherosclerosis due to lipid rich plaque: Secondary | ICD-10-CM

## 2020-04-29 DIAGNOSIS — F411 Generalized anxiety disorder: Secondary | ICD-10-CM | POA: Diagnosis not present

## 2020-04-29 DIAGNOSIS — I2510 Atherosclerotic heart disease of native coronary artery without angina pectoris: Secondary | ICD-10-CM

## 2020-04-29 DIAGNOSIS — R69 Illness, unspecified: Secondary | ICD-10-CM | POA: Diagnosis not present

## 2020-04-29 NOTE — Assessment & Plan Note (Signed)
History of hyperlipidemia not on statin therapy and potentially statin intolerant with lipid profile performed 02/21/2019 revealing total cholesterol 149, LDL of 76 and HDL of 59.  I am going to recheck a lipid and liver profile.

## 2020-04-29 NOTE — Patient Instructions (Signed)
Medication Instructions:  Your physician recommends that you continue on your current medications as directed. Please refer to the Current Medication list given to you today.  *If you need a refill on your cardiac medications before your next appointment, please call your pharmacy*   Lab Work: Your physician recommends that you return for lab work in: 1-2 weeks for lipid and liver profile.   If you have labs (blood work) drawn today and your tests are completely normal, you will receive your results only by: Marland Kitchen MyChart Message (if you have MyChart) OR . A paper copy in the mail If you have any lab test that is abnormal or we need to change your treatment, we will call you to review the results.   Follow-Up: At [facility], you and your health needs are our priority.  As part of our continuing mission to provide you with exceptional heart care, we have created designated Provider Care Teams.  These Care Teams include your primary Cardiologist (physician) and Advanced Practice Providers (APPs -  Physician Assistants and Nurse Practitioners) who all work together to provide you with the care you need, when you need it.  We recommend signing up for the patient portal called "MyChart".  Sign up information is provided on this After Visit Summary.  MyChart is used to connect with patients for Virtual Visits (Telemedicine).  Patients are able to view lab/test results, encounter notes, upcoming appointments, etc.  Non-urgent messages can be sent to your provider as well.   To learn more about what you can do with MyChart, go to [email or web address].    Your next appointment:   12 month(s)  The format for your next appointment:   In Person  Provider:   Quay Burow, MD

## 2020-04-29 NOTE — Assessment & Plan Note (Signed)
History of CAD status post mid LAD PCI drug-eluting stenting by myself using a Taxus drug-eluting stent May 2002.  He had a Myoview in 2013 which was nonischemic.  He denies chest pain or shortness of breath.

## 2020-04-29 NOTE — Progress Notes (Signed)
04/29/2020 Maurice Duffy   11-07-1950  [id number]  Primary Physician Merrilee Seashore, MD Primary Cardiologist: Lorretta Harp MD Garret Reddish, [place], [place]  HPI:  Maurice Duffy is a 69 y.o.  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 He has a history of dyslipidemia and discontinued tobacco abuse. He has known CAD status post mid LAD PCI and stenting by myself using a Taxus drug-eluting stent in May 2002. I restudied him several weeks later because of chest pain and found this to be widely patent. He also has GERD. He is a very anxious gentleman with manic depressive disorder. He had a negative Myoview in 2013.  Since I saw him a year ago he is remained stable.  He is no longer on a statin because he claims to be "statin intolerant.  He denies chest pain or shortness of breath.   Current Meds  Medication Sig  . azelastine (OPTIVAR) 0.05 % ophthalmic solution Place 1 drop into both eyes 2 (two) times daily.  . busPIRone (BUSPAR) 15 MG tablet Take 1 tablet (15 mg total) by mouth 3 (three) times daily.  . citalopram (CELEXA) 20 MG tablet Take 1 tablet (20 mg total) by mouth daily.  . [DISCONTINUED] traZODone (DESYREL) 50 MG tablet Take 1 tablet (50 mg total) at bedtime as needed by mouth for sleep.     No Known Allergies  Social History   Socioeconomic History  . Marital status: Married    Spouse name: Not on file  . Number of children: Not on file  . Years of education: Not on file  . Highest education level: Not on file  Occupational History  . 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 Smoking status: Former Smoker    Packs/day: 1.00    Years: 18.00    Pack years: 18.00    Quit date: 08/13/2011    Years since quitting: 8.7  . Smokeless tobacco: Never Used  Vaping Use  . Vaping Use: Never used  Substance and Sexual Activity  .  Alcohol use: No  . Drug use: No  . Sexual activity: Yes  Other Topics Concern  . Not on file  Social History Narrative   Married. Education: [employer].   Social Determinants of Health   Financial Resource Strain:   . Difficulty of Paying Living Expenses: Not on file  Food Insecurity:   . Worried About Charity fundraiser in the Last Year: Not on file  . Ran Out of Food in the Last Year: Not on file  Transportation Needs:   . Lack of Transportation (Medical): Not on file  . Lack of Transportation (Non-Medical): Not on file  Physical Activity:   . Days of Exercise per Week: Not on file  . Minutes of Exercise per Session: Not on file  Stress:   . Feeling of Stress : Not on file  Social Connections:   . Frequency of Communication with Friends and Family: Not on file  . Frequency of Social Gatherings with Friends and Family: Not on file  . Attends Religious Services: Not on file  . Active Member of Clubs or Organizations: Not on file  . Attends Archivist Meetings: Not on file  . Marital Status: Not on file  Intimate Partner Violence:   . Fear of Current or Ex-Partner: Not on  file  . Emotionally Abused: Not on file  . Physically Abused: Not on file  . Sexually Abused: Not on file     Review of Systems: General: negative for chills, fever, night sweats or weight changes.  Cardiovascular: negative for chest pain, dyspnea on exertion, edema, orthopnea, palpitations, paroxysmal nocturnal dyspnea or shortness of breath Dermatological: negative for rash Respiratory: negative for cough or wheezing Urologic: negative for hematuria Abdominal: negative for nausea, vomiting, diarrhea, bright red blood per rectum, melena, or hematemesis Neurologic: negative for visual changes, syncope, or dizziness All other systems reviewed and are otherwise negative except as noted above.    Blood pressure 113/72, pulse 63, height 5\' 9"  (1.753 m), weight 178 lb 9.6 oz (81 kg), SpO2 96 %.   General appearance: alert and no distress Neck: no adenopathy, no carotid bruit, no JVD, supple, symmetrical, trachea midline and thyroid not enlarged, symmetric, no tenderness/mass/nodules Lungs: clear to auscultation bilaterally Heart: regular rate and rhythm, S1, S2 normal, no murmur, click, rub or gallop Extremities: extremities normal, atraumatic, no cyanosis or edema Pulses: 2+ and symmetric Skin: Skin color, texture, turgor normal. No rashes or lesions Neurologic: Alert and oriented X 3, normal strength and tone. Normal symmetric reflexes. Normal coordination and gait  EKG sinus rhythm at 63 with a nonspecific IVCD and left axis deviation.  I personally reviewed this EKG.  ASSESSMENT AND PLAN:   CAD (coronary artery disease) History of CAD status post mid LAD PCI drug-eluting stenting by myself using a Taxus drug-eluting stent May 2002.  He had a Myoview in 2013 which was nonischemic.  He denies chest pain or shortness of breath.  Hyperlipidemia History of hyperlipidemia not on statin therapy and potentially statin intolerant with lipid profile performed 02/21/2019 revealing total cholesterol 149, LDL of 76 and HDL of 59.  I am going to recheck a lipid and liver profile.      Lorretta Harp MD FACP,FACC,FAHA, [facility] 04/29/2020 4:00 PM

## 2020-05-27 DIAGNOSIS — I2510 Atherosclerotic heart disease of native coronary artery without angina pectoris: Secondary | ICD-10-CM | POA: Diagnosis not present

## 2020-05-27 DIAGNOSIS — Z0000 Encounter for general adult medical examination without abnormal findings: Secondary | ICD-10-CM | POA: Diagnosis not present

## 2020-05-27 DIAGNOSIS — N401 Enlarged prostate with lower urinary tract symptoms: Secondary | ICD-10-CM | POA: Diagnosis not present

## 2020-05-27 DIAGNOSIS — N138 Other obstructive and reflux uropathy: Secondary | ICD-10-CM | POA: Diagnosis not present

## 2020-05-27 DIAGNOSIS — E782 Mixed hyperlipidemia: Secondary | ICD-10-CM | POA: Diagnosis not present

## 2020-06-03 DIAGNOSIS — R69 Illness, unspecified: Secondary | ICD-10-CM | POA: Diagnosis not present

## 2020-06-03 DIAGNOSIS — I2510 Atherosclerotic heart disease of native coronary artery without angina pectoris: Secondary | ICD-10-CM | POA: Diagnosis not present

## 2020-06-03 DIAGNOSIS — N401 Enlarged prostate with lower urinary tract symptoms: Secondary | ICD-10-CM | POA: Diagnosis not present

## 2020-06-03 DIAGNOSIS — E782 Mixed hyperlipidemia: Secondary | ICD-10-CM | POA: Diagnosis not present

## 2020-06-03 DIAGNOSIS — N138 Other obstructive and reflux uropathy: Secondary | ICD-10-CM | POA: Diagnosis not present

## 2020-06-03 DIAGNOSIS — Z789 Other specified health status: Secondary | ICD-10-CM | POA: Diagnosis not present

## 2020-06-03 DIAGNOSIS — Z0000 Encounter for general adult medical examination without abnormal findings: Secondary | ICD-10-CM | POA: Diagnosis not present

## 2020-07-02 DIAGNOSIS — Z1211 Encounter for screening for malignant neoplasm of colon: Secondary | ICD-10-CM | POA: Diagnosis not present

## 2020-07-02 DIAGNOSIS — K625 Hemorrhage of anus and rectum: Secondary | ICD-10-CM | POA: Diagnosis not present

## 2020-07-02 DIAGNOSIS — I2510 Atherosclerotic heart disease of native coronary artery without angina pectoris: Secondary | ICD-10-CM | POA: Diagnosis not present

## 2020-07-21 DIAGNOSIS — F411 Generalized anxiety disorder: Secondary | ICD-10-CM | POA: Diagnosis not present

## 2020-07-21 DIAGNOSIS — R69 Illness, unspecified: Secondary | ICD-10-CM | POA: Diagnosis not present

## 2020-08-07 DIAGNOSIS — Z1211 Encounter for screening for malignant neoplasm of colon: Secondary | ICD-10-CM | POA: Diagnosis not present

## 2020-09-05 DIAGNOSIS — Z20822 Contact with and (suspected) exposure to covid-19: Secondary | ICD-10-CM | POA: Diagnosis not present

## 2020-10-15 DIAGNOSIS — R69 Illness, unspecified: Secondary | ICD-10-CM | POA: Diagnosis not present

## 2020-10-15 DIAGNOSIS — F411 Generalized anxiety disorder: Secondary | ICD-10-CM | POA: Diagnosis not present

## 2020-12-30 DIAGNOSIS — F411 Generalized anxiety disorder: Secondary | ICD-10-CM | POA: Diagnosis not present

## 2020-12-30 DIAGNOSIS — R69 Illness, unspecified: Secondary | ICD-10-CM | POA: Diagnosis not present

## 2021-03-26 DIAGNOSIS — F411 Generalized anxiety disorder: Secondary | ICD-10-CM | POA: Diagnosis not present

## 2021-03-26 DIAGNOSIS — R69 Illness, unspecified: Secondary | ICD-10-CM | POA: Diagnosis not present

## 2021-04-01 DIAGNOSIS — E782 Mixed hyperlipidemia: Secondary | ICD-10-CM | POA: Diagnosis not present

## 2021-04-08 DIAGNOSIS — R3911 Hesitancy of micturition: Secondary | ICD-10-CM | POA: Diagnosis not present

## 2021-04-08 DIAGNOSIS — K117 Disturbances of salivary secretion: Secondary | ICD-10-CM | POA: Diagnosis not present

## 2021-04-08 DIAGNOSIS — R7989 Other specified abnormal findings of blood chemistry: Secondary | ICD-10-CM | POA: Diagnosis not present

## 2021-04-08 DIAGNOSIS — N401 Enlarged prostate with lower urinary tract symptoms: Secondary | ICD-10-CM | POA: Diagnosis not present

## 2021-04-22 DIAGNOSIS — R7989 Other specified abnormal findings of blood chemistry: Secondary | ICD-10-CM | POA: Diagnosis not present

## 2021-04-22 DIAGNOSIS — L84 Corns and callosities: Secondary | ICD-10-CM | POA: Diagnosis not present

## 2021-04-22 DIAGNOSIS — K2100 Gastro-esophageal reflux disease with esophagitis, without bleeding: Secondary | ICD-10-CM | POA: Diagnosis not present

## 2021-04-22 DIAGNOSIS — N401 Enlarged prostate with lower urinary tract symptoms: Secondary | ICD-10-CM | POA: Diagnosis not present

## 2021-05-05 DIAGNOSIS — M19072 Primary osteoarthritis, left ankle and foot: Secondary | ICD-10-CM | POA: Diagnosis not present

## 2021-05-05 DIAGNOSIS — M19071 Primary osteoarthritis, right ankle and foot: Secondary | ICD-10-CM | POA: Diagnosis not present

## 2021-05-05 DIAGNOSIS — L84 Corns and callosities: Secondary | ICD-10-CM | POA: Diagnosis not present

## 2021-05-05 DIAGNOSIS — M2141 Flat foot [pes planus] (acquired), right foot: Secondary | ICD-10-CM | POA: Diagnosis not present

## 2021-05-05 DIAGNOSIS — M2041 Other hammer toe(s) (acquired), right foot: Secondary | ICD-10-CM | POA: Diagnosis not present

## 2021-05-05 DIAGNOSIS — L603 Nail dystrophy: Secondary | ICD-10-CM | POA: Diagnosis not present

## 2021-05-05 DIAGNOSIS — M21621 Bunionette of right foot: Secondary | ICD-10-CM | POA: Diagnosis not present

## 2021-05-05 DIAGNOSIS — M2011 Hallux valgus (acquired), right foot: Secondary | ICD-10-CM | POA: Diagnosis not present

## 2021-05-05 DIAGNOSIS — M2142 Flat foot [pes planus] (acquired), left foot: Secondary | ICD-10-CM | POA: Diagnosis not present

## 2021-05-05 DIAGNOSIS — B351 Tinea unguium: Secondary | ICD-10-CM | POA: Diagnosis not present

## 2021-05-22 DIAGNOSIS — L603 Nail dystrophy: Secondary | ICD-10-CM | POA: Diagnosis not present

## 2021-06-11 DIAGNOSIS — M19072 Primary osteoarthritis, left ankle and foot: Secondary | ICD-10-CM | POA: Diagnosis not present

## 2021-06-11 DIAGNOSIS — L602 Onychogryphosis: Secondary | ICD-10-CM | POA: Diagnosis not present

## 2021-06-11 DIAGNOSIS — M21621 Bunionette of right foot: Secondary | ICD-10-CM | POA: Diagnosis not present

## 2021-06-11 DIAGNOSIS — M2011 Hallux valgus (acquired), right foot: Secondary | ICD-10-CM | POA: Diagnosis not present

## 2021-06-11 DIAGNOSIS — M19071 Primary osteoarthritis, right ankle and foot: Secondary | ICD-10-CM | POA: Diagnosis not present

## 2021-06-22 DIAGNOSIS — R69 Illness, unspecified: Secondary | ICD-10-CM | POA: Diagnosis not present

## 2021-06-22 DIAGNOSIS — F331 Major depressive disorder, recurrent, moderate: Secondary | ICD-10-CM | POA: Diagnosis not present

## 2021-06-22 DIAGNOSIS — F411 Generalized anxiety disorder: Secondary | ICD-10-CM | POA: Diagnosis not present

## 2021-07-08 DIAGNOSIS — R7989 Other specified abnormal findings of blood chemistry: Secondary | ICD-10-CM | POA: Diagnosis not present

## 2021-07-08 DIAGNOSIS — T466X5A Adverse effect of antihyperlipidemic and antiarteriosclerotic drugs, initial encounter: Secondary | ICD-10-CM | POA: Diagnosis not present

## 2021-07-08 DIAGNOSIS — I2510 Atherosclerotic heart disease of native coronary artery without angina pectoris: Secondary | ICD-10-CM | POA: Diagnosis not present

## 2021-07-08 DIAGNOSIS — G720 Drug-induced myopathy: Secondary | ICD-10-CM | POA: Diagnosis not present

## 2021-07-08 DIAGNOSIS — R5383 Other fatigue: Secondary | ICD-10-CM | POA: Diagnosis not present

## 2021-07-08 DIAGNOSIS — E782 Mixed hyperlipidemia: Secondary | ICD-10-CM | POA: Diagnosis not present

## 2021-07-08 DIAGNOSIS — K2100 Gastro-esophageal reflux disease with esophagitis, without bleeding: Secondary | ICD-10-CM | POA: Diagnosis not present

## 2021-07-08 DIAGNOSIS — Z125 Encounter for screening for malignant neoplasm of prostate: Secondary | ICD-10-CM | POA: Diagnosis not present

## 2021-07-15 DIAGNOSIS — E782 Mixed hyperlipidemia: Secondary | ICD-10-CM | POA: Diagnosis not present

## 2021-07-15 DIAGNOSIS — R7989 Other specified abnormal findings of blood chemistry: Secondary | ICD-10-CM | POA: Diagnosis not present

## 2021-07-15 DIAGNOSIS — Z0000 Encounter for general adult medical examination without abnormal findings: Secondary | ICD-10-CM | POA: Diagnosis not present

## 2021-07-15 DIAGNOSIS — G720 Drug-induced myopathy: Secondary | ICD-10-CM | POA: Diagnosis not present

## 2021-07-30 DIAGNOSIS — K760 Fatty (change of) liver, not elsewhere classified: Secondary | ICD-10-CM | POA: Diagnosis not present

## 2021-07-30 DIAGNOSIS — R7989 Other specified abnormal findings of blood chemistry: Secondary | ICD-10-CM | POA: Diagnosis not present

## 2021-08-12 DIAGNOSIS — G720 Drug-induced myopathy: Secondary | ICD-10-CM | POA: Diagnosis not present

## 2021-08-12 DIAGNOSIS — R7989 Other specified abnormal findings of blood chemistry: Secondary | ICD-10-CM | POA: Diagnosis not present

## 2021-08-12 DIAGNOSIS — E782 Mixed hyperlipidemia: Secondary | ICD-10-CM | POA: Diagnosis not present

## 2021-08-19 DIAGNOSIS — I2510 Atherosclerotic heart disease of native coronary artery without angina pectoris: Secondary | ICD-10-CM | POA: Diagnosis not present

## 2021-08-19 DIAGNOSIS — G720 Drug-induced myopathy: Secondary | ICD-10-CM | POA: Diagnosis not present

## 2021-08-19 DIAGNOSIS — Z789 Other specified health status: Secondary | ICD-10-CM | POA: Diagnosis not present

## 2021-08-19 DIAGNOSIS — E782 Mixed hyperlipidemia: Secondary | ICD-10-CM | POA: Diagnosis not present

## 2021-09-16 DIAGNOSIS — R69 Illness, unspecified: Secondary | ICD-10-CM | POA: Diagnosis not present

## 2021-09-16 DIAGNOSIS — F331 Major depressive disorder, recurrent, moderate: Secondary | ICD-10-CM | POA: Diagnosis not present

## 2021-09-16 DIAGNOSIS — F411 Generalized anxiety disorder: Secondary | ICD-10-CM | POA: Diagnosis not present

## 2021-12-16 DIAGNOSIS — F411 Generalized anxiety disorder: Secondary | ICD-10-CM | POA: Diagnosis not present

## 2021-12-16 DIAGNOSIS — F331 Major depressive disorder, recurrent, moderate: Secondary | ICD-10-CM | POA: Diagnosis not present

## 2021-12-16 DIAGNOSIS — R69 Illness, unspecified: Secondary | ICD-10-CM | POA: Diagnosis not present

## 2022-02-03 DIAGNOSIS — D235 Other benign neoplasm of skin of trunk: Secondary | ICD-10-CM | POA: Diagnosis not present

## 2022-02-03 DIAGNOSIS — L814 Other melanin hyperpigmentation: Secondary | ICD-10-CM | POA: Diagnosis not present

## 2022-02-03 DIAGNOSIS — D225 Melanocytic nevi of trunk: Secondary | ICD-10-CM | POA: Diagnosis not present

## 2022-02-03 DIAGNOSIS — L570 Actinic keratosis: Secondary | ICD-10-CM | POA: Diagnosis not present

## 2022-02-03 DIAGNOSIS — L579 Skin changes due to chronic exposure to nonionizing radiation, unspecified: Secondary | ICD-10-CM | POA: Diagnosis not present

## 2022-02-03 DIAGNOSIS — L821 Other seborrheic keratosis: Secondary | ICD-10-CM | POA: Diagnosis not present

## 2022-03-15 DIAGNOSIS — R69 Illness, unspecified: Secondary | ICD-10-CM | POA: Diagnosis not present

## 2022-03-15 DIAGNOSIS — F331 Major depressive disorder, recurrent, moderate: Secondary | ICD-10-CM | POA: Diagnosis not present

## 2022-03-15 DIAGNOSIS — F411 Generalized anxiety disorder: Secondary | ICD-10-CM | POA: Diagnosis not present

## 2022-05-28 DIAGNOSIS — F411 Generalized anxiety disorder: Secondary | ICD-10-CM | POA: Diagnosis not present

## 2022-05-28 DIAGNOSIS — R69 Illness, unspecified: Secondary | ICD-10-CM | POA: Diagnosis not present

## 2022-05-28 DIAGNOSIS — F331 Major depressive disorder, recurrent, moderate: Secondary | ICD-10-CM | POA: Diagnosis not present

## 2022-08-24 DIAGNOSIS — R69 Illness, unspecified: Secondary | ICD-10-CM | POA: Diagnosis not present

## 2022-08-24 DIAGNOSIS — F411 Generalized anxiety disorder: Secondary | ICD-10-CM | POA: Diagnosis not present

## 2022-08-24 DIAGNOSIS — F331 Major depressive disorder, recurrent, moderate: Secondary | ICD-10-CM | POA: Diagnosis not present

## 2022-10-13 DIAGNOSIS — E782 Mixed hyperlipidemia: Secondary | ICD-10-CM | POA: Diagnosis not present

## 2022-10-13 DIAGNOSIS — I2510 Atherosclerotic heart disease of native coronary artery without angina pectoris: Secondary | ICD-10-CM | POA: Diagnosis not present

## 2022-10-13 DIAGNOSIS — R5383 Other fatigue: Secondary | ICD-10-CM | POA: Diagnosis not present

## 2022-10-20 DIAGNOSIS — E782 Mixed hyperlipidemia: Secondary | ICD-10-CM | POA: Diagnosis not present

## 2022-10-20 DIAGNOSIS — G720 Drug-induced myopathy: Secondary | ICD-10-CM | POA: Diagnosis not present

## 2022-10-20 DIAGNOSIS — I2510 Atherosclerotic heart disease of native coronary artery without angina pectoris: Secondary | ICD-10-CM | POA: Diagnosis not present

## 2022-10-20 DIAGNOSIS — Z0000 Encounter for general adult medical examination without abnormal findings: Secondary | ICD-10-CM | POA: Diagnosis not present

## 2022-10-27 DIAGNOSIS — Z125 Encounter for screening for malignant neoplasm of prostate: Secondary | ICD-10-CM | POA: Diagnosis not present

## 2022-11-16 DIAGNOSIS — F411 Generalized anxiety disorder: Secondary | ICD-10-CM | POA: Diagnosis not present

## 2022-11-16 DIAGNOSIS — F331 Major depressive disorder, recurrent, moderate: Secondary | ICD-10-CM | POA: Diagnosis not present

## 2022-12-09 DIAGNOSIS — N401 Enlarged prostate with lower urinary tract symptoms: Secondary | ICD-10-CM | POA: Diagnosis not present

## 2022-12-09 DIAGNOSIS — R3911 Hesitancy of micturition: Secondary | ICD-10-CM | POA: Diagnosis not present

## 2023-02-10 DIAGNOSIS — F411 Generalized anxiety disorder: Secondary | ICD-10-CM | POA: Diagnosis not present

## 2023-02-10 DIAGNOSIS — F331 Major depressive disorder, recurrent, moderate: Secondary | ICD-10-CM | POA: Diagnosis not present

## 2023-03-01 DIAGNOSIS — L700 Acne vulgaris: Secondary | ICD-10-CM | POA: Diagnosis not present

## 2023-03-01 DIAGNOSIS — W908XXS Exposure to other nonionizing radiation, sequela: Secondary | ICD-10-CM | POA: Diagnosis not present

## 2023-03-01 DIAGNOSIS — L578 Other skin changes due to chronic exposure to nonionizing radiation: Secondary | ICD-10-CM | POA: Diagnosis not present

## 2023-03-01 DIAGNOSIS — L821 Other seborrheic keratosis: Secondary | ICD-10-CM | POA: Diagnosis not present

## 2023-03-01 DIAGNOSIS — Z129 Encounter for screening for malignant neoplasm, site unspecified: Secondary | ICD-10-CM | POA: Diagnosis not present

## 2023-03-01 DIAGNOSIS — D225 Melanocytic nevi of trunk: Secondary | ICD-10-CM | POA: Diagnosis not present

## 2023-03-19 ENCOUNTER — Emergency Department (HOSPITAL_BASED_OUTPATIENT_CLINIC_OR_DEPARTMENT_OTHER): Payer: Medicare HMO

## 2023-03-19 ENCOUNTER — Encounter (HOSPITAL_BASED_OUTPATIENT_CLINIC_OR_DEPARTMENT_OTHER): Payer: Self-pay

## 2023-03-19 ENCOUNTER — Other Ambulatory Visit: Payer: Self-pay

## 2023-03-19 ENCOUNTER — Emergency Department (HOSPITAL_BASED_OUTPATIENT_CLINIC_OR_DEPARTMENT_OTHER)
Admission: EM | Admit: 2023-03-19 | Discharge: 2023-03-19 | Disposition: A | Discharge status: Home / Self Care | Payer: Medicare HMO

## 2023-03-19 DIAGNOSIS — R001 Bradycardia, unspecified: Secondary | ICD-10-CM | POA: Diagnosis not present

## 2023-03-19 DIAGNOSIS — K5900 Constipation, unspecified: Secondary | ICD-10-CM | POA: Insufficient documentation

## 2023-03-19 DIAGNOSIS — R079 Chest pain, unspecified: Secondary | ICD-10-CM | POA: Diagnosis not present

## 2023-03-19 DIAGNOSIS — R109 Unspecified abdominal pain: Secondary | ICD-10-CM | POA: Diagnosis not present

## 2023-03-19 DIAGNOSIS — Q438 Other specified congenital malformations of intestine: Secondary | ICD-10-CM | POA: Diagnosis not present

## 2023-03-19 DIAGNOSIS — R1084 Generalized abdominal pain: Secondary | ICD-10-CM | POA: Diagnosis not present

## 2023-03-19 DIAGNOSIS — K828 Other specified diseases of gallbladder: Secondary | ICD-10-CM | POA: Diagnosis not present

## 2023-03-19 DIAGNOSIS — R1010 Upper abdominal pain, unspecified: Secondary | ICD-10-CM | POA: Diagnosis not present

## 2023-03-19 DIAGNOSIS — R1011 Right upper quadrant pain: Secondary | ICD-10-CM | POA: Diagnosis not present

## 2023-03-19 LAB — CBC
HCT: 42.3 % (ref 39.0–52.0)
Hemoglobin: 14.5 g/dL (ref 13.0–17.0)
MCH: 30.3 pg (ref 26.0–34.0)
MCHC: 34.3 g/dL (ref 30.0–36.0)
MCV: 88.3 fL (ref 80.0–100.0)
Platelets: 230 10*3/uL (ref 150–400)
RBC: 4.79 MIL/uL (ref 4.22–5.81)
RDW: 12.8 % (ref 11.5–15.5)
WBC: 4.6 10*3/uL (ref 4.0–10.5)
nRBC: 0 % (ref 0.0–0.2)

## 2023-03-19 LAB — COMPREHENSIVE METABOLIC PANEL
ALT: 27 U/L (ref 0–44)
AST: 29 U/L (ref 15–41)
Albumin: 4 g/dL (ref 3.5–5.0)
Alkaline Phosphatase: 72 U/L (ref 38–126)
Anion gap: 9 (ref 5–15)
BUN: 9 mg/dL (ref 8–23)
CO2: 25 mmol/L (ref 22–32)
Calcium: 9 mg/dL (ref 8.9–10.3)
Chloride: 105 mmol/L (ref 98–111)
Creatinine, Ser: 0.87 mg/dL (ref 0.61–1.24)
GFR, Estimated: >60 mL/min (ref >60)
Glucose, Bld: 120 mg/dL — ABNORMAL HIGH (ref 70–99)
Potassium: 4.1 mmol/L (ref 3.5–5.1)
Sodium: 139 mmol/L (ref 135–145)
Total Bilirubin: 0.6 mg/dL (ref 0.3–1.2)
Total Protein: 6.1 g/dL — ABNORMAL LOW (ref 6.5–8.1)

## 2023-03-19 LAB — LIPASE, BLOOD: Lipase: <10 U/L — ABNORMAL LOW (ref 11–51)

## 2023-03-19 LAB — LACTIC ACID, PLASMA: Lactic Acid, Venous: 1.6 mmol/L (ref 0.5–1.9)

## 2023-03-19 LAB — TROPONIN I (HIGH SENSITIVITY): Troponin I (High Sensitivity): 11 ng/L (ref <18)

## 2023-03-19 MED ORDER — ALUM & MAG HYDROXIDE-SIMETH 200-200-20 MG/5ML PO SUSP
30.0000 mL | Freq: Once | ORAL | Status: AC
  Administered 2023-03-19: 30 mL via ORAL
  Filled 2023-03-19: qty 30

## 2023-03-19 MED ORDER — IOHEXOL 300 MG/ML  SOLN
100.0000 mL | Freq: Once | INTRAMUSCULAR | Status: AC | PRN
  Administered 2023-03-19: 80 mL via INTRAVENOUS

## 2023-03-19 MED ORDER — HYDROMORPHONE HCL 1 MG/ML IJ SOLN
0.5000 mg | Freq: Once | INTRAMUSCULAR | Status: AC
  Administered 2023-03-19: 0.5 mg via INTRAVENOUS
  Filled 2023-03-19: qty 1

## 2023-03-19 NOTE — Discharge Instructions (Addendum)
We discussed the workup in the ER today.  This showed that you are moderately constipated.  I recommend gentle bowel cleanout, consider Dulcolax suppository and Dulcolax oral tablet, or prune juice.  Please consider a liquid diet for the next 24 to 48 hours, with only light foods including applesauce, with plenty of fluids, including broth, Gatorade, water, and other fluids.  This is until you begin moving your bowels with regularity again.  We talked about your gallbladder being distended on the ultrasound and CT scan today.  Sometimes people can have attacks of gallbladder pain is called biliary colic.  Included information for you to read about as well as a gallbladder eating plan.  Please pay attention to this information and avoid eating foods that may trigger your gallbladder.  In the meantime you can call to schedule follow-up appointment with a general surgeon at the number above.  If your pain does return or worsen, we discussed returning to the ER at the hospital.

## 2023-03-19 NOTE — ED Provider Notes (Signed)
[facility] EMERGENCY DEPARTMENT AT [facility] Provider Note   CSN: [id number] Arrival date & time: 03/19/23  4259     History {Add pertinent medical, surgical, social history, OB history to HPI:1} Chief Complaint  Patient presents with   Abdominal Pain    Maurice Duffy is a 72 y.o. male.  72 year old male presenting emergency department with right upper quadrant abdominal pain.  Symptoms started roughly 3 hours ago.  Sudden onset.  Associated with nausea, but no vomiting.  Normal bowel movement this morning.  No fevers, chest pain or shortness of breath.  Reports remote history of prior hernia repair, but notes that he still has his gallbladder.  Describes pain as a dull ache.  Constant.  Nonradiating.   Abdominal Pain      Home Medications Prior to Admission medications   Medication Sig Start Date End Date Taking? Authorizing Provider  azelastine (OPTIVAR) 0.05 % ophthalmic solution Place 1 drop into both eyes 2 (two) times daily. 02/26/16   Shade Flood, MD  busPIRone (BUSPAR) 15 MG tablet Take 1 tablet (15 mg total) by mouth 3 (three) times daily. 06/23/17   Benjaman Pott, MD  citalopram (CELEXA) 20 MG tablet Take 1 tablet (20 mg total) by mouth daily. 06/23/17 04/29/20  Benjaman Pott, MD      Allergies    Patient has no known allergies.    Review of Systems   Review of Systems  Gastrointestinal:  Positive for abdominal pain.    Physical Exam Updated Vital Signs BP 124/60 (BP Location: Right Arm)   Pulse (!) 48   Temp (!) 97.5 F (36.4 C) (Oral)   Resp 18   Ht 5\' 9"  (1.753 m)   Wt 78.9 kg   SpO2 100%   BMI 25.70 kg/m  Physical Exam Vitals and nursing note reviewed.  Constitutional:      General: He is not in acute distress.    Appearance: He is not toxic-appearing.  HENT:     Head: Normocephalic.  Cardiovascular:     Rate and Rhythm: Normal rate and regular rhythm.  Pulmonary:     Effort: Pulmonary effort is normal.  Abdominal:      General: Abdomen is flat.     Palpations: Abdomen is soft.     Tenderness: There is abdominal tenderness in the right upper quadrant. There is no guarding or rebound.  Neurological:     General: No focal deficit present.     Mental Status: He is alert.  Psychiatric:        Mood and Affect: Mood normal.        Behavior: Behavior normal.     ED Results / Procedures / Treatments   Labs (all labs ordered are listed, but only abnormal results are displayed) Labs Reviewed  CBC  COMPREHENSIVE METABOLIC PANEL  LIPASE, BLOOD  LACTIC ACID, PLASMA  TROPONIN I (HIGH SENSITIVITY)    EKG EKG Interpretation Date/Time:  Saturday March 19 2023 08:28:30 EDT Ventricular Rate:  49 PR Interval:    QRS Duration:  117 QT Interval:  511 QTC Calculation: 462 R Axis:   43  Text Interpretation: Sinus bradycardia Nonspecific intraventricular conduction delay Atrial premature complexes Confirmed by Estanislado Pandy [id number]) on 03/19/2023 8:38:52 AM  Radiology No results found.  Procedures Procedures  {Document cardiac monitor, telemetry assessment procedure when appropriate:1}  Medications Ordered in ED Medications  alum & mag hydroxide-simeth (MAALOX/MYLANTA) 200-200-20 MG/5ML suspension 30 mL (30 mLs Oral Given 03/19/23 0833)  ED Course/ Medical Decision Making/ A&P   {   Click here for ABCD2, HEART and other calculatorsREFRESH Note before signing :1}                              Medical Decision Making 72 year old male present emergency department with right upper quadrant abdominal pain.  Patient is bradycardic in the 50s, but appears to be sinus rhythm on monitor on my independent interpretation.  He notes that heart rate is often in the 50s to 60s.  He has some mild tenderness in his right upper quadrant, but nonsurgical abdomen.  Given abrupt onset with associated nausea concern for hepatobiliary disease.  Wife at bedside notes that he was told that he has fatty liver.  He also reports  history of CAD.  Will get abdominal screening labs and CT scan.  Given patient's age and CAD will also get EKG, chest x-ray and troponin to evaluate for atypical chest pain.  Will give GI cocktail for symptoms.  See ED course for further MDM and disposition.  Amount and/or Complexity of Data Reviewed Labs: ordered. Decision-making details documented in ED Course. Radiology: ordered. Decision-making details documented in ED Course. ECG/medicine tests: ordered and independent interpretation performed.    Details: Appears to be sinus bradycardia at a rate of 49 with normal intervals.  No blocks.  No QTc prolongation.  I do not appreciate any ST segment changes that would indicate ischemia.  Risk OTC drugs.    {Document critical care time when appropriate:1} {Document review of labs and clinical decision tools ie heart score, Chads2Vasc2 etc:1}  {Document your independent review of radiology images, and any outside records:1} {Document your discussion with family members, caretakers, and with consultants:1} {Document social determinants of health affecting pt's care:1} {Document your decision making why or why not admission, treatments were needed:1} Final Clinical Impression(s) / ED Diagnoses Final diagnoses:  None    Rx / DC Orders ED Discharge Orders     None

## 2023-03-19 NOTE — ED Notes (Signed)
Patient able to hold down water and apple sauce. Patient ambulated with steady gait to restroom. Patient reports he is feeling better. MD notified.

## 2023-03-19 NOTE — ED Provider Notes (Signed)
72 yo male presenting with RUQ abdominal pain, distended GB on CT imaging Pending RUQ ultrasound LFT and lipase wnl Also noted to be constipated on CT imaging  Physical Exam  BP 128/64   Pulse (!) 58   Temp 97.6 F (36.4 C) (Oral)   Resp 20   Ht 5\' 9"  (1.753 m)   Wt 78.9 kg   SpO2 95%   BMI 25.70 kg/m   Physical Exam  Procedures  Procedures  ED Course / MDM   Clinical Course as of 03/19/23 1703  Sat Mar 19, 2023  0902 CBC No leukocytosis to suggest systemic infection.  No anemia [TY]  0918 Lactic Acid, Venous: 1.6 Reassuring.  Systemic infection less likely.  Mesenteric ischemia/bowel ischemia less likely. [TY]  [id number] Troponin I (High Sensitivity): 11 Reassuring, ACS unlikely given symptoms been ongoing for 3+ hours [TY]  1025 CT ABDOMEN PELVIS W CONTRAST IMPRESSION: Moderate colonic stool.  No bowel obstruction.  Dilated gallbladder. If there is concern of acute gallbladder pathology a ultrasound is recommended as the next step.  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 Please correlate for any known history.   [TY]  1455 Pending ultrasound, patient has required further pain medication.  Labs overall reassuring. [TY]    Clinical Course User Index [TY] Coral Spikes, DO   Medical Decision Making Amount and/or Complexity of Data Reviewed Labs: ordered. Decision-making details documented in ED Course. Radiology: ordered. Decision-making details documented in ED Course. ECG/medicine tests: ordered.  Risk OTC drugs. Prescription drug management.   Patient was reassessed after CT and ultrasound imaging.  Both of these noted distended gallbladder.  CT was questioning potential pancreatic ductal stones.  However there were no other acute stigmata of cholecystitis.  His LFTs and lipase were within normal limits.  I reassessed the patient at 5 PM.  This was over 3 hours  after his last dose of IV pain medicine.  He reports that his pain felt that it had gone away.  I examined him at this time and he did not have a positive Murphy sign or any right upper quadrant tenderness.  He reports he did pass a small amount of gas.  We discussed his medical workup.  It is possible he is experiencing biliary colic versus a mild ileus.  He reports that 2 weeks ago he dramatically changed his diet to a strictly legume, vegetable, not in water diet, which was a radical departure from his past diet.  You may also be experiencing some episodes of functional constipation as he has a moderate stool burden.  We discussed the plan to treat with a bowel cleanout.  I can still give him the general surgeons office number for referral for his distended gallbladder, potential biliary colic.  Return precautions were also discussed including for new or worsening or recurring pain and vomiting.  Both the patient and his family were bedside verbalized understanding are quite comfortable going home.  At this time he is wanting to avoid any potential surgery.  The patient was p.o. challenged here in the ED with some light foods as well as fluids.  Following p.o. challenge, he had no recurring abdominal pain or symptoms.  He was able to ambulate in the ED and said he felt fine to go home.  His wife is taking him home  Advised to stick to a liquid diet for the next 24 to 48 hours  and consider a gentle bowel cleanout.  We also provided a gallbladder eating plan.      Terald Sleeper, MD 03/19/23 [phone]

## 2023-03-19 NOTE — ED Triage Notes (Signed)
Pt presents pov with complaints of RUQ abdominal pain with sudden onset 30 mins ago. Pt states that he woke up with the pain. Nausea+. Denies any other symptoms.

## 2023-03-19 NOTE — ED Notes (Signed)
Water and applesauce given to patient for PO challenge.

## 2023-04-13 DIAGNOSIS — R7989 Other specified abnormal findings of blood chemistry: Secondary | ICD-10-CM | POA: Diagnosis not present

## 2023-04-13 DIAGNOSIS — E782 Mixed hyperlipidemia: Secondary | ICD-10-CM | POA: Diagnosis not present

## 2023-04-13 DIAGNOSIS — G720 Drug-induced myopathy: Secondary | ICD-10-CM | POA: Diagnosis not present

## 2023-04-13 DIAGNOSIS — I2510 Atherosclerotic heart disease of native coronary artery without angina pectoris: Secondary | ICD-10-CM | POA: Diagnosis not present

## 2023-04-20 DIAGNOSIS — I2510 Atherosclerotic heart disease of native coronary artery without angina pectoris: Secondary | ICD-10-CM | POA: Diagnosis not present

## 2023-04-20 DIAGNOSIS — E782 Mixed hyperlipidemia: Secondary | ICD-10-CM | POA: Diagnosis not present

## 2023-04-20 DIAGNOSIS — N401 Enlarged prostate with lower urinary tract symptoms: Secondary | ICD-10-CM | POA: Diagnosis not present

## 2023-04-20 DIAGNOSIS — G720 Drug-induced myopathy: Secondary | ICD-10-CM | POA: Diagnosis not present

## 2023-05-02 DIAGNOSIS — F331 Major depressive disorder, recurrent, moderate: Secondary | ICD-10-CM | POA: Diagnosis not present

## 2023-05-02 DIAGNOSIS — F411 Generalized anxiety disorder: Secondary | ICD-10-CM | POA: Diagnosis not present

## 2023-06-22 DIAGNOSIS — H18413 Arcus senilis, bilateral: Secondary | ICD-10-CM | POA: Diagnosis not present

## 2023-06-22 DIAGNOSIS — H16223 Keratoconjunctivitis sicca, not specified as Sjogren's, bilateral: Secondary | ICD-10-CM | POA: Diagnosis not present

## 2023-06-22 DIAGNOSIS — H43813 Vitreous degeneration, bilateral: Secondary | ICD-10-CM | POA: Diagnosis not present

## 2023-06-22 DIAGNOSIS — H2513 Age-related nuclear cataract, bilateral: Secondary | ICD-10-CM | POA: Diagnosis not present

## 2023-06-22 DIAGNOSIS — H25043 Posterior subcapsular polar age-related cataract, bilateral: Secondary | ICD-10-CM | POA: Diagnosis not present

## 2023-07-26 DIAGNOSIS — L578 Other skin changes due to chronic exposure to nonionizing radiation: Secondary | ICD-10-CM | POA: Diagnosis not present

## 2023-07-26 DIAGNOSIS — L700 Acne vulgaris: Secondary | ICD-10-CM | POA: Diagnosis not present

## 2023-07-26 DIAGNOSIS — W908XXS Exposure to other nonionizing radiation, sequela: Secondary | ICD-10-CM | POA: Diagnosis not present

## 2023-10-20 DIAGNOSIS — F411 Generalized anxiety disorder: Secondary | ICD-10-CM | POA: Diagnosis not present

## 2023-10-20 DIAGNOSIS — F331 Major depressive disorder, recurrent, moderate: Secondary | ICD-10-CM | POA: Diagnosis not present

## 2023-10-26 DIAGNOSIS — E782 Mixed hyperlipidemia: Secondary | ICD-10-CM | POA: Diagnosis not present

## 2023-10-26 DIAGNOSIS — I2510 Atherosclerotic heart disease of native coronary artery without angina pectoris: Secondary | ICD-10-CM | POA: Diagnosis not present

## 2023-10-26 DIAGNOSIS — G720 Drug-induced myopathy: Secondary | ICD-10-CM | POA: Diagnosis not present

## 2023-10-26 DIAGNOSIS — Z125 Encounter for screening for malignant neoplasm of prostate: Secondary | ICD-10-CM | POA: Diagnosis not present

## 2023-10-26 DIAGNOSIS — R7989 Other specified abnormal findings of blood chemistry: Secondary | ICD-10-CM | POA: Diagnosis not present

## 2023-10-26 DIAGNOSIS — R5383 Other fatigue: Secondary | ICD-10-CM | POA: Diagnosis not present

## 2023-11-02 DIAGNOSIS — G720 Drug-induced myopathy: Secondary | ICD-10-CM | POA: Diagnosis not present

## 2023-11-02 DIAGNOSIS — Z125 Encounter for screening for malignant neoplasm of prostate: Secondary | ICD-10-CM | POA: Diagnosis not present

## 2023-11-02 DIAGNOSIS — E782 Mixed hyperlipidemia: Secondary | ICD-10-CM | POA: Diagnosis not present

## 2023-11-02 DIAGNOSIS — I2510 Atherosclerotic heart disease of native coronary artery without angina pectoris: Secondary | ICD-10-CM | POA: Diagnosis not present

## 2023-11-02 DIAGNOSIS — Z0000 Encounter for general adult medical examination without abnormal findings: Secondary | ICD-10-CM | POA: Diagnosis not present

## 2023-11-10 DIAGNOSIS — W908XXS Exposure to other nonionizing radiation, sequela: Secondary | ICD-10-CM | POA: Diagnosis not present

## 2023-11-10 DIAGNOSIS — L578 Other skin changes due to chronic exposure to nonionizing radiation: Secondary | ICD-10-CM | POA: Diagnosis not present

## 2024-04-23 DIAGNOSIS — F411 Generalized anxiety disorder: Secondary | ICD-10-CM | POA: Diagnosis not present

## 2024-04-23 DIAGNOSIS — F331 Major depressive disorder, recurrent, moderate: Secondary | ICD-10-CM | POA: Diagnosis not present

## 2024-04-28 DIAGNOSIS — H1013 Acute atopic conjunctivitis, bilateral: Secondary | ICD-10-CM | POA: Diagnosis not present

## 2024-04-28 DIAGNOSIS — J309 Allergic rhinitis, unspecified: Secondary | ICD-10-CM | POA: Diagnosis not present
